# Patient Record
Sex: Female | Born: 1975 | Race: White | Hispanic: No | Marital: Married | Smoking: Never smoker
Health system: Southern US, Community
[De-identification: ages and names within clinical notes are randomized; demographics above are authoritative.]

## PROBLEM LIST (undated history)

## (undated) ENCOUNTER — Emergency Department: Discharge status: Absent without leave | Payer: Self-pay

## (undated) DIAGNOSIS — K5790 Diverticulosis of intestine, part unspecified, without perforation or abscess without bleeding: Secondary | ICD-10-CM

## (undated) DIAGNOSIS — F419 Anxiety disorder, unspecified: Secondary | ICD-10-CM

## (undated) DIAGNOSIS — K219 Gastro-esophageal reflux disease without esophagitis: Secondary | ICD-10-CM

## (undated) DIAGNOSIS — K602 Anal fissure, unspecified: Secondary | ICD-10-CM

## (undated) DIAGNOSIS — I10 Essential (primary) hypertension: Secondary | ICD-10-CM

## (undated) DIAGNOSIS — F909 Attention-deficit hyperactivity disorder, unspecified type: Secondary | ICD-10-CM

## (undated) DIAGNOSIS — K8020 Calculus of gallbladder without cholecystitis without obstruction: Secondary | ICD-10-CM

## (undated) DIAGNOSIS — D649 Anemia, unspecified: Secondary | ICD-10-CM

## (undated) DIAGNOSIS — K259 Gastric ulcer, unspecified as acute or chronic, without hemorrhage or perforation: Secondary | ICD-10-CM

## (undated) DIAGNOSIS — R7303 Prediabetes: Secondary | ICD-10-CM

## (undated) DIAGNOSIS — K859 Acute pancreatitis without necrosis or infection, unspecified: Secondary | ICD-10-CM

## (undated) DIAGNOSIS — M1990 Unspecified osteoarthritis, unspecified site: Secondary | ICD-10-CM

## (undated) DIAGNOSIS — M549 Dorsalgia, unspecified: Secondary | ICD-10-CM

## (undated) DIAGNOSIS — F329 Major depressive disorder, single episode, unspecified: Secondary | ICD-10-CM

## (undated) DIAGNOSIS — G4730 Sleep apnea, unspecified: Secondary | ICD-10-CM

## (undated) DIAGNOSIS — F32A Depression, unspecified: Secondary | ICD-10-CM

## (undated) HISTORY — DX: Diverticulosis of intestine, part unspecified, without perforation or abscess without bleeding: K57.90

## (undated) HISTORY — PX: ESOPHAGOGASTRODUODENOSCOPY: SHX1529

## (undated) HISTORY — PX: ECTOPIC PREGNANCY SURGERY: SHX613

## (undated) HISTORY — DX: Anemia, unspecified: D64.9

## (undated) HISTORY — DX: Morbid (severe) obesity due to excess calories: E66.01

## (undated) HISTORY — DX: Major depressive disorder, single episode, unspecified: F32.9

## (undated) HISTORY — PX: ABLATION: SHX5711

## (undated) HISTORY — PX: TUBAL LIGATION: SHX77

## (undated) HISTORY — DX: Anal fissure, unspecified: K60.2

## (undated) HISTORY — DX: Gastric ulcer, unspecified as acute or chronic, without hemorrhage or perforation: K25.9

## (undated) HISTORY — DX: Calculus of gallbladder without cholecystitis without obstruction: K80.20

## (undated) HISTORY — DX: Anxiety disorder, unspecified: F41.9

## (undated) HISTORY — DX: Gastro-esophageal reflux disease without esophagitis: K21.9

## (undated) HISTORY — DX: Depression, unspecified: F32.A

---

## 1998-04-21 HISTORY — PX: CHOLECYSTECTOMY: SHX55

## 2011-07-27 ENCOUNTER — Encounter: Payer: Self-pay | Admitting: *Deleted

## 2011-07-27 ENCOUNTER — Emergency Department
Admit: 2011-07-27 | Discharge: 2011-07-27 | Disposition: A | Discharge status: Home / Self Care | Payer: Managed Care, Other (non HMO)

## 2011-07-27 ENCOUNTER — Emergency Department (INDEPENDENT_AMBULATORY_CARE_PROVIDER_SITE_OTHER)
Admission: EM | Admit: 2011-07-27 | Discharge: 2011-07-27 | Disposition: A | Discharge status: Home / Self Care | Payer: Managed Care, Other (non HMO) | Source: Home / Self Care | Attending: Family Medicine | Admitting: Family Medicine

## 2011-07-27 DIAGNOSIS — IMO0002 Reserved for concepts with insufficient information to code with codable children: Secondary | ICD-10-CM

## 2011-07-27 DIAGNOSIS — S43401A Unspecified sprain of right shoulder joint, initial encounter: Secondary | ICD-10-CM

## 2011-07-27 HISTORY — DX: Dorsalgia, unspecified: M54.9

## 2011-07-27 HISTORY — DX: Unspecified osteoarthritis, unspecified site: M19.90

## 2011-07-27 HISTORY — DX: Sleep apnea, unspecified: G47.30

## 2011-07-27 HISTORY — DX: Essential (primary) hypertension: I10

## 2011-07-27 HISTORY — DX: Acute pancreatitis without necrosis or infection, unspecified: K85.90

## 2011-07-27 NOTE — ED Notes (Signed)
Pt was playing around with her husband yesterday and began to have R upper arm pain.  Pt has limited movement due to pain.

## 2011-07-27 NOTE — Discharge Instructions (Signed)
Apply ice pack for 30 to 45 minutes three or four times daily until improved. Take 2 Aleve tabs every 12 hours. Begin exercises as per instruction sheet.

## 2011-07-27 NOTE — ED Provider Notes (Signed)
History     CSN: [id number]  Arrival date & time 07/27/11  1203   First MD Initiated Contact with Patient 07/27/11 1241      Chief Complaint  Patient presents with  . Arm Pain    R upper arm      HPI Comments: Patient was playing around with her husband and children yesterday when she fell and felt pain in her right shoulder.  She has persistent pain when raising her right arm upwards.  No weakness or paresthesias  Patient is a 36 y.o. female presenting with shoulder pain. The history is provided by the patient and the spouse.  Shoulder Pain This is a new problem. The current episode started yesterday. The problem occurs constantly. The problem has not changed since onset.Pertinent negatives include no chest pain and no shortness of breath. Exacerbated by: moving right arm/shoulder. The symptoms are relieved by nothing. She has tried a warm compress for the symptoms. The treatment provided no relief.    Past Medical History  Diagnosis Date  . Sleep apnea   . Arthritis   . Hypertension   . Back pain   . Migraine   . Pancreatitis     Past Surgical History  Procedure Date  . Ectopic pregnancy surgery     twice  . Cholecystectomy   . Tubal ligation     Family History  Problem Relation Age of Onset  . Asthma Father     History  Substance Use Topics  . Smoking status: Never Smoker   . Smokeless tobacco: Not on file  . Alcohol Use: Yes     once a month    OB History    Grav Para Term Preterm Abortions TAB SAB Ect Mult Living                  Review of Systems  Respiratory: Negative for shortness of breath.   Cardiovascular: Negative for chest pain.  All other systems reviewed and are negative.    Allergies  Review of patient's allergies indicates no known allergies.  Home Medications   Current Outpatient Rx  Name Route Sig Dispense Refill  . ARMODAFINIL 150 MG PO TABS Oral Take 150 mg by mouth daily.    Marland Kitchen VITAMIN C 100 MG PO TABS Oral Take 100 mg by  mouth daily.    . BUPROPION HCL ER (XL) 150 MG PO TB24 Oral Take 300 mg by mouth daily.    Marland Kitchen VITAMIN D-3 PO Oral Take by mouth.    . CLONAZEPAM 0.5 MG PO TABS Oral Take 0.5 mg by mouth 3 (three) times daily as needed.    . DEXLANSOPRAZOLE 30 MG PO CPDR Oral Take 30 mg by mouth daily.    Marland Kitchen FEXOFENADINE HCL 30 MG PO TABS Oral Take 30 mg by mouth 2 (two) times daily.    . OMEGA-3 FATTY ACIDS 1000 MG PO CAPS Oral Take 2 g by mouth daily.    Marland Kitchen FLUTICASONE PROPIONATE 50 MCG/ACT NA SUSP Nasal Place 2 sprays into the nose daily.    Marland Kitchen LAMOTRIGINE 200 MG PO TABS Oral Take 200 mg by mouth daily.    Marland Kitchen METOPROLOL SUCCINATE ER 50 MG PO TB24 Oral Take 50 mg by mouth daily. Take with or immediately following a meal.    . QUETIAPINE FUMARATE ER 300 MG PO TB24 Oral Take 300 mg by mouth at bedtime.    Marland Kitchen VITAMIN B-12 100 MCG PO TABS Oral Take 50 mcg by mouth  daily.    Marland Kitchen VITAMIN E 100 UNITS PO CAPS Oral Take 100 Units by mouth daily.      BP 125/87  Pulse 105  Temp(Src) 98.5 F (36.9 C) (Oral)  Resp 16  Ht 5\' 3"  (1.6 m)  Wt 262 lb 8 oz (119.069 kg)  BMI 46.50 kg/m2  SpO2 97%  Physical Exam  Nursing note and vitals reviewed. Constitutional: She is oriented to person, place, and time. She appears well-developed and well-nourished.       Patient is obese (BMI 46.6)  HENT:  Head: Normocephalic and atraumatic.  Eyes: Conjunctivae are normal. Pupils are equal, round, and reactive to light.  Neck: Normal range of motion.  Cardiovascular: Normal rate and regular rhythm.   Pulmonary/Chest: Effort normal and breath sounds normal.  Musculoskeletal:       Right shoulder: She exhibits tenderness, bony tenderness and pain. She exhibits normal range of motion, no swelling, no effusion, no crepitus, no deformity, no laceration and normal pulse.       Arms:      Patient's right shoulder has full range of motion.  She can actively abduct above horizontal.  There is tenderness over the distal acromion and biceps  tendon insertion as noted on diagram.  Distal Neurovascular function is intact.   Neurological: She is alert and oriented to person, place, and time.  Skin: Skin is warm and dry.    ED Course  Procedures none   Dg Shoulder Right  07/27/2011  *RADIOLOGY REPORT*  Clinical Data: Right shoulder pain secondary to an injury yesterday.  RIGHT SHOULDER - 2+ VIEW  Comparison: None.  Findings: No fracture, dislocation, or soft tissue calcification. Osseous structures are normal.  IMPRESSION: Normal exam.  Original Report Authenticated By: Gwynn Burly, M.D.     1. Sprain of right shoulder       MDM  Mild rotator cuff injury is possible, but patient is able to fully abduct her right shoulder.  Apply ice pack for 30 to 45 minutes three or four times daily until improved. Take 2 Aleve tabs every 12 hours. Begin exercises as per instruction sheet ([facility] information and instruction handout given)  Followup with [facility] if not improving about two weeks.         Lattie Haw, MD 07/28/11 1450

## 2012-04-21 HISTORY — PX: BARIATRIC SURGERY: SHX1103

## 2013-01-06 DIAGNOSIS — I10 Essential (primary) hypertension: Secondary | ICD-10-CM | POA: Insufficient documentation

## 2013-10-04 DIAGNOSIS — G43909 Migraine, unspecified, not intractable, without status migrainosus: Secondary | ICD-10-CM | POA: Insufficient documentation

## 2013-10-04 DIAGNOSIS — F331 Major depressive disorder, recurrent, moderate: Secondary | ICD-10-CM | POA: Insufficient documentation

## 2013-10-11 ENCOUNTER — Ambulatory Visit (HOSPITAL_COMMUNITY): Payer: Self-pay | Admitting: Psychiatry

## 2014-01-04 DIAGNOSIS — Z9884 Bariatric surgery status: Secondary | ICD-10-CM | POA: Insufficient documentation

## 2014-08-31 DIAGNOSIS — K219 Gastro-esophageal reflux disease without esophagitis: Secondary | ICD-10-CM | POA: Insufficient documentation

## 2014-08-31 DIAGNOSIS — K449 Diaphragmatic hernia without obstruction or gangrene: Secondary | ICD-10-CM | POA: Insufficient documentation

## 2015-01-31 DIAGNOSIS — F900 Attention-deficit hyperactivity disorder, predominantly inattentive type: Secondary | ICD-10-CM | POA: Insufficient documentation

## 2016-05-29 DIAGNOSIS — F411 Generalized anxiety disorder: Secondary | ICD-10-CM | POA: Insufficient documentation

## 2016-06-10 ENCOUNTER — Encounter: Payer: Self-pay | Admitting: Gastroenterology

## 2016-06-10 ENCOUNTER — Encounter (HOSPITAL_COMMUNITY): Payer: Self-pay | Admitting: *Deleted

## 2016-06-10 ENCOUNTER — Emergency Department (HOSPITAL_COMMUNITY)
Admission: EM | Admit: 2016-06-10 | Discharge: 2016-06-11 | Disposition: A | Discharge status: Home / Self Care | Payer: 59 | Attending: Emergency Medicine | Admitting: Emergency Medicine

## 2016-06-10 DIAGNOSIS — I10 Essential (primary) hypertension: Secondary | ICD-10-CM | POA: Insufficient documentation

## 2016-06-10 DIAGNOSIS — R1011 Right upper quadrant pain: Secondary | ICD-10-CM | POA: Diagnosis not present

## 2016-06-10 DIAGNOSIS — Z9049 Acquired absence of other specified parts of digestive tract: Secondary | ICD-10-CM | POA: Diagnosis not present

## 2016-06-10 DIAGNOSIS — R1010 Upper abdominal pain, unspecified: Secondary | ICD-10-CM | POA: Diagnosis present

## 2016-06-10 DIAGNOSIS — N9489 Other specified conditions associated with female genital organs and menstrual cycle: Secondary | ICD-10-CM | POA: Diagnosis not present

## 2016-06-10 DIAGNOSIS — Z79899 Other long term (current) drug therapy: Secondary | ICD-10-CM | POA: Diagnosis not present

## 2016-06-10 LAB — URINALYSIS, ROUTINE W REFLEX MICROSCOPIC
BILIRUBIN URINE: NEGATIVE
Bacteria, UA: NONE SEEN
GLUCOSE, UA: NEGATIVE mg/dL
HGB URINE DIPSTICK: NEGATIVE
Ketones, ur: 5 mg/dL — AB
NITRITE: NEGATIVE
PH: 7 (ref 5.0–8.0)
Protein, ur: NEGATIVE mg/dL
SPECIFIC GRAVITY, URINE: 1.024 (ref 1.005–1.030)

## 2016-06-10 LAB — COMPREHENSIVE METABOLIC PANEL
ALBUMIN: 3.9 g/dL (ref 3.5–5.0)
ALK PHOS: 57 U/L (ref 38–126)
ALT: 21 U/L (ref 14–54)
AST: 20 U/L (ref 15–41)
Anion gap: 7 (ref 5–15)
BUN: 13 mg/dL (ref 6–20)
CALCIUM: 9.8 mg/dL (ref 8.9–10.3)
CO2: 27 mmol/L (ref 22–32)
Chloride: 102 mmol/L (ref 101–111)
Creatinine, Ser: 0.72 mg/dL (ref 0.44–1.00)
GFR calc Af Amer: >60 mL/min (ref >60)
GLUCOSE: 112 mg/dL — AB (ref 65–99)
POTASSIUM: 4.2 mmol/L (ref 3.5–5.1)
Sodium: 136 mmol/L (ref 135–145)
Total Bilirubin: 0.9 mg/dL (ref 0.3–1.2)
Total Protein: 7 g/dL (ref 6.5–8.1)

## 2016-06-10 LAB — HCG, QUANTITATIVE, PREGNANCY: hCG, Beta Chain, Quant, S: 1 m[IU]/mL (ref <5)

## 2016-06-10 LAB — CBC
HCT: 39.7 % (ref 36.0–46.0)
Hemoglobin: 13.2 g/dL (ref 12.0–15.0)
MCH: 28 pg (ref 26.0–34.0)
MCHC: 33.2 g/dL (ref 30.0–36.0)
MCV: 84.3 fL (ref 78.0–100.0)
PLATELETS: 216 10*3/uL (ref 150–400)
RBC: 4.71 MIL/uL (ref 3.87–5.11)
RDW: 12.8 % (ref 11.5–15.5)
WBC: 5.9 10*3/uL (ref 4.0–10.5)

## 2016-06-10 LAB — LIPASE, BLOOD: Lipase: 20 U/L (ref 11–51)

## 2016-06-10 LAB — I-STAT BETA HCG BLOOD, ED (MC, WL, AP ONLY): I-stat hCG, quantitative: 8.2 m[IU]/mL — ABNORMAL HIGH (ref <5)

## 2016-06-10 LAB — PREGNANCY, URINE: PREG TEST UR: NEGATIVE

## 2016-06-10 MED ORDER — ONDANSETRON 4 MG PO TBDP: 4.0000 mg | ORAL_TABLET | Freq: Three times a day (TID) | ORAL | 0 refills | Status: DC | PRN

## 2016-06-10 MED ORDER — ONDANSETRON 4 MG PO TBDP
8.0000 mg | ORAL_TABLET | Freq: Once | ORAL | Status: AC
  Administered 2016-06-10: 8 mg via ORAL
  Filled 2016-06-10: qty 2

## 2016-06-10 NOTE — ED Triage Notes (Signed)
Pt reports ongoing abd pain, had ct scan done yesterday but doesn't know results. Pt has diarrhea, no vomiting. Hx of pancreatitis.

## 2016-06-10 NOTE — ED Notes (Signed)
Pt ambulatory to restroom with independent, steady gait

## 2016-06-10 NOTE — ED Provider Notes (Signed)
[facility]-EMERGENCY DEPT Provider Note   CSN: [id number] Arrival date & time: 06/10/16  4098     History   Chief Complaint Chief Complaint  Patient presents with  . Abdominal Pain    HPI Natalie Hopkins is a 41 y.o. female.  Patient presents to the ED with a chief complaint of abdominal pain.  She reports that she has had upper abdominal pain for the past several days.  She reports a history of pancreatitis and is worried about a pancreatitis flare.  She reports associated nausea, but denies vomiting.  She states that she has had some associated diarrhea.  She denies and fevers or chills.  She states that she had a CT scan yesterday, but doesn't have the results yet.  She denies any dysuria or vaginal discharge.   The history is provided by the patient. No language interpreter was used.    Past Medical History:  Diagnosis Date  . Arthritis   . Back pain   . Hypertension   . Migraine   . Pancreatitis   . Sleep apnea     There are no active problems to display for this patient.   Past Surgical History:  Procedure Laterality Date  . CHOLECYSTECTOMY    . ECTOPIC PREGNANCY SURGERY     twice  . TUBAL LIGATION      OB History    No data available       Home Medications    Prior to Admission medications   Medication Sig Start Date End Date Taking? Authorizing Provider  Armodafinil (NUVIGIL) 150 MG tablet Take 150 mg by mouth daily.    Historical Provider, MD  Ascorbic Acid (VITAMIN C) 100 MG tablet Take 100 mg by mouth daily.    Historical Provider, MD  buPROPion (WELLBUTRIN XL) 150 MG 24 hr tablet Take 300 mg by mouth daily.    Historical Provider, MD  Cholecalciferol (VITAMIN D-3 PO) Take by mouth.    Historical Provider, MD  clonazePAM (KLONOPIN) 0.5 MG tablet Take 0.5 mg by mouth 3 (three) times daily as needed.    Historical Provider, MD  Dexlansoprazole (DEXILANT) 30 MG capsule Take 30 mg by mouth daily.    Historical Provider, MD  fexofenadine (ALLEGRA) 30 MG  tablet Take 30 mg by mouth 2 (two) times daily.    Historical Provider, MD  fish oil-omega-3 fatty acids 1000 MG capsule Take 2 g by mouth daily.    Historical Provider, MD  fluticasone (FLONASE) 50 MCG/ACT nasal spray Place 2 sprays into the nose daily.    Historical Provider, MD  lamoTRIgine (LAMICTAL) 200 MG tablet Take 200 mg by mouth daily.    Historical Provider, MD  metoprolol succinate (TOPROL-XL) 50 MG 24 hr tablet Take 50 mg by mouth daily. Take with or immediately following a meal.    Historical Provider, MD  QUEtiapine (SEROQUEL XR) 300 MG 24 hr tablet Take 300 mg by mouth at bedtime.    Historical Provider, MD  vitamin B-12 (CYANOCOBALAMIN) 100 MCG tablet Take 50 mcg by mouth daily.    Historical Provider, MD  vitamin E 100 UNIT capsule Take 100 Units by mouth daily.    Historical Provider, MD    Family History Family History  Problem Relation Age of Onset  . Asthma Father     Social History Social History  Substance Use Topics  . Smoking status: Never Smoker  . Smokeless tobacco: Not on file  . Alcohol use Yes     Comment: once a  month     Allergies   Patient has no known allergies.   Review of Systems Review of Systems  All other systems reviewed and are negative.    Physical Exam Updated Vital Signs BP 134/80 (BP Location: Left Arm)   Pulse 76   Temp 98.3 F (36.8 C) (Oral)   Resp 18   LMP 05/27/2016   SpO2 100%   Physical Exam  Constitutional: She is oriented to person, place, and time. She appears well-developed and well-nourished.  HENT:  Head: Normocephalic and atraumatic.  Eyes: Conjunctivae and EOM are normal. Pupils are equal, round, and reactive to light.  Neck: Normal range of motion. Neck supple.  Cardiovascular: Normal rate and regular rhythm.  Exam reveals no gallop and no friction rub.   No murmur heard. Pulmonary/Chest: Effort normal and breath sounds normal. No respiratory distress. She has no wheezes. She has no rales. She exhibits  no tenderness.  Abdominal: Soft. Bowel sounds are normal. She exhibits no distension and no mass. There is no tenderness. There is no rebound and no guarding.  No focal abdominal tenderness, no RLQ tenderness or pain at McBurney's point, no RUQ tenderness or Murphy's sign, no left-sided abdominal tenderness, no fluid wave, or signs of peritonitis   Musculoskeletal: Normal range of motion. She exhibits no edema or tenderness.  Neurological: She is alert and oriented to person, place, and time.  Skin: Skin is warm and dry.  Psychiatric: She has a normal mood and affect. Her behavior is normal. Judgment and thought content normal.  Nursing note and vitals reviewed.    ED Treatments / Results  Labs (all labs ordered are listed, but only abnormal results are displayed) Labs Reviewed  COMPREHENSIVE METABOLIC PANEL - Abnormal; Notable for the following:       Result Value   Glucose, Bld 112 (*)    All other components within normal limits  I-STAT BETA HCG BLOOD, ED ([facility], [facility], [facility] ONLY) - Abnormal; Notable for the following:    I-stat hCG, quantitative 8.2 (*)    All other components within normal limits  LIPASE, BLOOD  CBC  URINALYSIS, ROUTINE W REFLEX MICROSCOPIC  HCG, QUANTITATIVE, PREGNANCY  PREGNANCY, URINE    EKG  EKG Interpretation None       Radiology No results found.  Procedures Procedures (including critical care time)  Medications Ordered in ED Medications - No data to display   Initial Impression / Assessment and Plan / ED Course  I have reviewed the triage vital signs and the nursing notes.  Pertinent labs & imaging results that were available during my care of the patient were reviewed by me and considered in my medical decision making (see chart for details).     Patient with abdominal pain in the right upper quadrant.  Concerned about pancreatitis.  She has hx of cholecystectomy.  CT from outside facility yesterday was reviewed, no acute abnormality.   Presumed falsely elevated I-stat HCG, lab HCG and urine preg are negative.  Patient has tubal ligation.  I discussed this with Dr. Clayborne Dana, who agrees likely lab error.  Return if symptoms worsen, otherwise, recommend follow-up with GI.  Patient understands and agrees with the plan.  She is stable and ready for discharge.  Final Clinical Impressions(s) / ED Diagnoses   Final diagnoses:  Right upper quadrant abdominal pain    New Prescriptions New Prescriptions   ONDANSETRON (ZOFRAN ODT) 4 MG DISINTEGRATING TABLET    Take 1 tablet (4 mg total) by  mouth every 8 (eight) hours as needed for nausea or vomiting.     Roxy Horsemanobert Jamond Neels, PA-C 06/10/16 1422    Marily MemosJason Mesner, MD 06/11/16 [phone]

## 2016-06-10 NOTE — Discharge Instructions (Signed)
Please follow up with the gastroenterologist.

## 2016-06-19 ENCOUNTER — Ambulatory Visit: Payer: Self-pay | Admitting: Gastroenterology

## 2016-06-27 ENCOUNTER — Other Ambulatory Visit (INDEPENDENT_AMBULATORY_CARE_PROVIDER_SITE_OTHER): Payer: 59

## 2016-06-27 ENCOUNTER — Encounter: Payer: Self-pay | Admitting: Gastroenterology

## 2016-06-27 ENCOUNTER — Ambulatory Visit (INDEPENDENT_AMBULATORY_CARE_PROVIDER_SITE_OTHER): Payer: 59 | Admitting: Gastroenterology

## 2016-06-27 VITALS — BP 118/80 | HR 80 | Ht 63.0 in | Wt 219.5 lb

## 2016-06-27 DIAGNOSIS — K589 Irritable bowel syndrome without diarrhea: Secondary | ICD-10-CM

## 2016-06-27 DIAGNOSIS — R1013 Epigastric pain: Secondary | ICD-10-CM

## 2016-06-27 DIAGNOSIS — K219 Gastro-esophageal reflux disease without esophagitis: Secondary | ICD-10-CM

## 2016-06-27 LAB — TSH: TSH: 2.45 u[IU]/mL (ref 0.35–4.50)

## 2016-06-27 LAB — IGA: IGA: 248 mg/dL (ref 68–378)

## 2016-06-27 NOTE — Progress Notes (Addendum)
06/27/2016 Natalie StareBridget Hopkins [id number] 04/27/1975  CC:  Abdominal pain, alternating bowel habits  HISTORY OF PRESENT ILLNESS:  This is a 41 year-old female who is new to our office. She has past medical history of anxiety and depression, GERD, pancreatitis, hypertension, sleep apnea. She had a cholecystectomy in 2008 and sleeve gastrectomy in 2014. She tells me that [place] [facility] her pancreatitis episodes. She says that she's had 2 or 3 episodes of pancreatitis. They've done extensive testing and have apparently not been able to identify a source. She denies alcohol use. Her last episode was reportedly 2 years ago.  She said that she had GI history with [place] and had both endoscopy and colonoscopy performed by him, but that was several years ago.  Today she complains of both right upper quadrant/epigastric and lower abdominal pains. She also reports alternating constipation and diarrhea, but constipation predominant with intermittent nausea. She says that the diarrheal episodes tend to occur and follow an episode of severe constipation. Has tried a probiotic without much success in the past.  Her upper abdominal pain is described as a sharp stabbing type pain and her lower abdominal pain is described as cramping. Both pains seem to come and go and she is currently not having much pain at all at this time. She had a CT scan of the abdomen and pelvis with contrast on February 19 that showed no findings to suggest acute pancreatitis, and no other acute findings in the abdomen or pelvis. She was noted to have postcholecystectomy changes with similar dilation of her bile duct compared to prior studies. 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 MRCP in November 2016 showed a similar finding with no filling defects.  She also  reports acid reflux and was just placed on Dexilant 30 mg daily about one week ago and has already noticed some improvement with that.     Past Medical History:  Diagnosis Date  . Anal fissure   . Anemia   . Anxiety   . Arthritis   . Back pain   . Depression   . Diverticulosis    on CT  . Gallstones   . GERD (gastroesophageal reflux disease)   . Hypertension   . Migraine   . Multiple gastric ulcers   . Pancreatitis   . Sleep apnea   . Sleep apnea    Past Surgical History:  Procedure Laterality Date  . 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 CHOLECYSTECTOMY  2000  . ECTOPIC PREGNANCY SURGERY     twice, with removal of 1 tube  . TUBAL LIGATION      reports that she has never smoked. She has never used smokeless tobacco. She reports that she drinks alcohol. She reports that she does not use drugs. family history includes Asthma in her father; Bladder Cancer in her mother; Breast cancer in her maternal grandmother; Diabetes in her paternal grandfather; Ovarian cancer in her maternal aunt. No Known Allergies    Outpatient Encounter Prescriptions as of 06/27/2016  Medication Sig  . Ascorbic Acid (VITAMIN C) 100 MG tablet Take 100 mg by mouth daily.  Marland Kitchen. buPROPion (WELLBUTRIN XL) 150 MG 24 hr tablet Take 300 mg by mouth daily.  . Cholecalciferol (VITAMIN D-3 PO) Take by mouth.  .Marland Kitchen  clonazePAM (KLONOPIN) 0.5 MG tablet Take 0.5 mg by mouth 3 (three) times daily as needed.  Marland Kitchen Dexlansoprazole (DEXILANT) 30 MG capsule Take 30 mg by mouth daily.  . fexofenadine (ALLEGRA) 30 MG tablet Take 30 mg by mouth 2 (two) times daily.  . fish oil-omega-3 fatty acids 1000 MG capsule Take 2 g by mouth daily.  . fluticasone (FLONASE) 50 MCG/ACT nasal spray Place 2 sprays into the nose daily.  . Multiple Vitamin (MULTIVITAMIN) tablet Take 1 tablet by mouth daily.  . ondansetron (ZOFRAN ODT) 4 MG disintegrating tablet Take 1 tablet (4 mg total) by mouth every 8 (eight) hours as needed for nausea  or vomiting.  . vitamin B-12 (CYANOCOBALAMIN) 100 MCG tablet Take 50 mcg by mouth daily.  . vitamin E 100 UNIT capsule Take 100 Units by mouth daily.  . [DISCONTINUED] Armodafinil (NUVIGIL) 150 MG tablet Take 150 mg by mouth daily.  . [DISCONTINUED] lamoTRIgine (LAMICTAL) 200 MG tablet Take 200 mg by mouth daily.  . [DISCONTINUED] metoprolol succinate (TOPROL-XL) 50 MG 24 hr tablet Take 50 mg by mouth daily. Take with or immediately following a meal.  . [DISCONTINUED] QUEtiapine (SEROQUEL XR) 300 MG 24 hr tablet Take 300 mg by mouth at bedtime.   No facility-administered encounter medications on file as of 06/27/2016.      REVIEW OF SYSTEMS  : All other systems reviewed and negative except where noted in the History of Present Illness.   PHYSICAL EXAM: BP 118/80 (BP Location: Left Arm, Patient Position: Sitting, Cuff Size: Normal)   Pulse 80   Ht 5\' 3"  (1.6 m) Comment: height measured without shoes  Wt 219 lb 8 oz (99.6 kg)   LMP 06/20/2016   BMI 38.88 kg/m  General: Well developed white female in no acute distress Head: Normocephalic and atraumatic Eyes:  Sclerae anicteric, conjunctiva pink. Ears: Normal auditory acuity Lungs: Clear throughout to auscultation; no increased WOB. Heart: Regular rate and rhythm Abdomen: Soft, non-distended.  Normal bowel sounds.  Mild epigastric TTP. Musculoskeletal: Symmetrical with no gross deformities  Skin: No lesions on visible extremities Extremities: No edema  Neurological: Alert oriented x 4, grossly non-focal Psychological:  Alert and cooperative. Normal mood and affect  ASSESSMENT AND PLAN: -Bowel habits alternating from constipation to diarrhea, but constipation predominant:  I suspect that she likely has IBS as she has undergone evaluation from a GI standpoint several years ago that was reportedly unremarkable.  I'm going to have her start MiraLAX daily, increase to twice daily if needed. May also need to consider adding a daily fiber  supplement such as Benefiber or Citrucel to her regimen as well to help with stool bulk.  We'll check TSH and celiac labs. -GERD:  Just started on Dexilant 30 mg daily about 1 week ago.  Already  feels like it is helping to some degree. She will continue this for now. 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 Status post cholecystectomy in 2000.  *Will follow-up in 4-6 weeks. *In the interim we are going to try to obtain records from both of the GI physicians that she has seen in the once in [place] area including Dr. Jayme Cloud and Dr. Eliberto Ivory.  **Received some previous GI records. EGD and upper endoscopic ultrasound in March 2013 that showed mild to moderate reflux esophagitis, normal common bile duct, absent gallbladder. Also, "Pancreas with suggestion of prior acute pancreatitis with no mass  or cystic lesions but evidence of heterogeneity in the head from prior inflammation likely, there may be some early changes of chronic pancreatitis. Given overall fullness of the pancreas parenchyma in hyperechogenicity, this may be related to fatty infiltration of the pancreas which is nonspecific versus mild edema. Of note, her IgG4 level has been negative in the past and her CT scans although they have showed edematous pancreas without ductal dilation has not been specific for autoimmune pancreatitis. 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   CC:  Long, Scott, PA-C  Agree with Ms. Rise Mu management.  Iva Boop, MD, [facility]

## 2016-06-27 NOTE — Patient Instructions (Signed)
Please purchase the following medications over the counter and take as directed:  Start Miralax daily, increase to twice a day if needed.   Continue Dexilant.  Your physician has requested that you go to the basement for lab work before leaving today.

## 2016-06-30 LAB — TISSUE TRANSGLUTAMINASE, IGA: TISSUE TRANSGLUTAMINASE AB, IGA: 1 U/mL (ref <4)

## 2016-07-01 ENCOUNTER — Encounter: Payer: Self-pay | Admitting: Gastroenterology

## 2016-07-01 DIAGNOSIS — K589 Irritable bowel syndrome without diarrhea: Secondary | ICD-10-CM | POA: Insufficient documentation

## 2016-07-01 DIAGNOSIS — K219 Gastro-esophageal reflux disease without esophagitis: Secondary | ICD-10-CM | POA: Insufficient documentation

## 2016-07-01 DIAGNOSIS — R1013 Epigastric pain: Secondary | ICD-10-CM | POA: Insufficient documentation

## 2016-08-05 ENCOUNTER — Ambulatory Visit: Payer: Self-pay | Admitting: Internal Medicine

## 2016-11-22 ENCOUNTER — Encounter: Payer: Self-pay | Admitting: Emergency Medicine

## 2016-11-22 ENCOUNTER — Emergency Department
Admission: EM | Admit: 2016-11-22 | Discharge: 2016-11-22 | Disposition: A | Discharge status: Home / Self Care | Payer: 59 | Source: Home / Self Care | Attending: Family Medicine | Admitting: Family Medicine

## 2016-11-22 DIAGNOSIS — G43009 Migraine without aura, not intractable, without status migrainosus: Secondary | ICD-10-CM

## 2016-11-22 DIAGNOSIS — R002 Palpitations: Secondary | ICD-10-CM

## 2016-11-22 DIAGNOSIS — M26621 Arthralgia of right temporomandibular joint: Secondary | ICD-10-CM

## 2016-11-22 MED ORDER — METOCLOPRAMIDE HCL 5 MG/ML IJ SOLN
5.0000 mg | Freq: Once | INTRAMUSCULAR | Status: AC
  Administered 2016-11-22: 5 mg via INTRAMUSCULAR

## 2016-11-22 MED ORDER — DEXAMETHASONE SODIUM PHOSPHATE 10 MG/ML IJ SOLN
10.0000 mg | Freq: Once | INTRAMUSCULAR | Status: AC
  Administered 2016-11-22: 10 mg via INTRAMUSCULAR

## 2016-11-22 MED ORDER — KETOROLAC TROMETHAMINE 60 MG/2ML IM SOLN
60.0000 mg | Freq: Once | INTRAMUSCULAR | Status: AC
  Administered 2016-11-22: 60 mg via INTRAMUSCULAR

## 2016-11-22 NOTE — ED Provider Notes (Signed)
[name]    CSN: [id number] Arrival date & time: 11/22/16  1452     History   Chief Complaint Chief Complaint  Patient presents with  . Headache    HPI Natalie Hopkins is a 41 y.o. female.   Patient complains of right side migraine headache and earache for 4 days.  She has had nausea without vomiting.  No fevers, chills, and sweats.  She gets about two similar headaches per year. She also complains of occasional palpitations without chest pain or shortness of breath   The history is provided by the patient.  Migraine  This is a new problem. The current episode started more than 2 days ago. The problem occurs constantly. The problem has not changed since onset.Associated symptoms include headaches. Pertinent negatives include no chest pain, no abdominal pain and no shortness of breath. Nothing aggravates the symptoms. Nothing relieves the symptoms. Treatments tried: Advil and muscle relaxant.    Past Medical History:  Diagnosis Date  . Anal fissure   . Anemia   . Anxiety   . Arthritis   . Back pain   . Depression   . Diverticulosis    on CT  . Gallstones   . GERD (gastroesophageal reflux disease)   . Hypertension   . Migraine   . Multiple gastric ulcers   . Pancreatitis   . Sleep apnea   . Sleep apnea     Patient Active Problem List   Diagnosis Date Noted  . Irritable bowel syndrome 07/01/2016  . Abdominal pain, epigastric 07/01/2016  . Gastroesophageal reflux disease 07/01/2016    Past Surgical History:  Procedure Laterality Date  . 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 CHOLECYSTECTOMY  2000  . ECTOPIC PREGNANCY SURGERY     twice, with removal of 1 tube  . TUBAL LIGATION      OB History    No data available       Home Medications    Prior to Admission medications   Medication Sig Start Date End Date Taking? Authorizing Provider  FLUoxetine (PROZAC) 10 MG capsule Take 10 mg by mouth daily.   Yes [provider]    traZODone (DESYREL) 100 MG tablet Take 100 mg by mouth at bedtime.   Yes [provider]  Ascorbic Acid (VITAMIN C) 100 MG tablet Take 100 mg by mouth daily.    [provider]  buPROPion (WELLBUTRIN XL) 150 MG 24 hr tablet Take 300 mg by mouth daily.    [provider]  Cholecalciferol (VITAMIN D-3 PO) Take by mouth.    [provider]  clonazePAM (KLONOPIN) 0.5 MG tablet Take 0.5 mg by mouth 3 (three) times daily as needed.    [provider]  Dexlansoprazole (DEXILANT) 30 MG capsule Take 30 mg by mouth daily.    [provider]  fexofenadine (ALLEGRA) 30 MG tablet Take 30 mg by mouth 2 (two) times daily.    [provider]  fish oil-omega-3 fatty acids 1000 MG capsule Take 2 g by mouth daily.    [provider]  fluticasone (FLONASE) 50 MCG/ACT nasal spray Place 2 sprays into the nose daily.    [provider]  Multiple Vitamin (MULTIVITAMIN) tablet Take 1 tablet by mouth daily.    [provider]  ondansetron (ZOFRAN ODT) 4 MG disintegrating tablet Take 1 tablet (4 mg total) by mouth every 8 (eight) hours as needed for nausea or vomiting. 06/10/16   Roxy Horseman,  PA-C  vitamin B-12 (CYANOCOBALAMIN) 100 MCG tablet Take 50 mcg by mouth daily.    [provider]  vitamin E 100 UNIT capsule Take 100 Units by mouth daily.    [provider]    Family History Family History  Problem Relation Age of Onset  . Asthma Father   . Bladder Cancer Mother   . Breast cancer Maternal Grandmother   . Diabetes Paternal Grandfather   . Ovarian cancer Maternal Aunt     Social History Social History  Substance Use Topics  . Smoking status: Never Smoker  . Smokeless tobacco: Never Used  . Alcohol use Yes     Comment: once a month     Allergies   Patient has no known allergies.   Review of Systems Review of Systems  Constitutional: Positive for activity change, appetite change and  fatigue. Negative for chills, diaphoresis and fever.  HENT: Negative.   Eyes: Negative.   Respiratory: Negative.  Negative for shortness of breath.   Cardiovascular: Positive for palpitations. Negative for chest pain.  Gastrointestinal: Positive for nausea. Negative for abdominal pain and vomiting.  Genitourinary: Negative.   Musculoskeletal: Negative.   Skin: Negative.   Neurological: Positive for headaches. Negative for tremors, seizures, syncope, speech difficulty, weakness and numbness.     Physical Exam Triage Vital Signs ED Triage Vitals [11/22/16 1516]  Enc Vitals Group     BP 125/82     Pulse Rate 100     Resp      Temp 98.3 F (36.8 C)     Temp Source Oral     SpO2 97 %     Weight 229 lb (103.9 kg)     Height      Head Circumference      Peak Flow      Pain Score 8     Pain Loc      Pain Edu?      Excl. in GC?    No data found.   Updated Vital Signs BP 125/82 (BP Location: Right Arm)   Pulse 100   Temp 98.3 F (36.8 C) (Oral)   Wt 229 lb (103.9 kg)   LMP 11/13/2016 (Approximate)   SpO2 97%   BMI 40.57 kg/m   Visual Acuity Right Eye Distance:   Left Eye Distance:   Bilateral Distance:    Right Eye Near:   Left Eye Near:    Bilateral Near:     Physical Exam Nursing notes and Vital Signs reviewed. Appearance:  Patient appears stated age, and in no acute distress Eyes:  Pupils are equal, round, and reactive to light and accomodation.  Extraocular movement is intact.  Conjunctivae are not inflamed.  Fundi benign; no photophobia.  Ears:  Canals normal.  Tympanic membranes normal.  There is distinct tenderness over the right temporomandibular joint.  Palpation there recreates her pain.   Nose:  Mildly congested turbinates.  No sinus tenderness.    Pharynx:  Normal Neck:  Supple.  No adenopathy. Lungs:  Clear to auscultation.  Breath sounds are equal.  Moving air well. Heart:  Regular rate and rhythm without murmurs, rubs, or gallops.  Abdomen:   Nontender without masses or hepatosplenomegaly.  Bowel sounds are present.  No CVA or flank tenderness.  Extremities:  No edema.  Skin:  No rash present.   Neurologic:  Cranial nerves 2 through 12 are normal.  Patellar, achilles, and elbow reflexes are normal.  Cerebellar function is intact (finger-to-nose and rapid  alternating hand movement).  Gait and station are normal.   UC Treatments / Results  Labs (all labs ordered are listed, but only abnormal results are displayed) Labs Reviewed - No data to display  EKG  EKG Interpretation None       Radiology No results found.  Procedures Procedures (including critical care time)  Medications Ordered in UC Medications  ketorolac (TORADOL) injection 60 mg (60 mg Intramuscular Given 11/22/16 1612)  dexamethasone (DECADRON) injection 10 mg (10 mg Intramuscular Given 11/22/16 1612)  metoCLOPramide (REGLAN) injection 5 mg (5 mg Intramuscular Given 11/22/16 1612)     Initial Impression / Assessment and Plan / UC Course  I have reviewed the triage vital signs and the nursing notes.  Pertinent labs & imaging results that were available during my care of the patient were reviewed by me and considered in my medical decision making (see chart for details).    Administered Toradol 60mg  IM  Administered Decadron 10mg  IM.  Administered Reglan 5mg  IM. Recommend dental and/or ENT evaluation for right TMJ pain. If symptoms become significantly worse during the night or over the weekend, proceed to the local emergency room.  Recommend follow-up with cardiologist for history of palpitations.    Final Clinical Impressions(s) / UC Diagnoses   Final diagnoses:  Migraine without aura and without status migrainosus, not intractable  Arthralgia of right temporomandibular joint  Palpitations    New Prescriptions Discharge Medication List as of 11/22/2016  3:59 PM       Lattie HawBeese, Stephen A, MD 12/01/16 [phone]

## 2016-11-22 NOTE — ED Triage Notes (Signed)
Pt c/o HA, right ear pain and SOB x4 days. Also states she has been having heart palpitations for the past year. Advised by ER that she needs to see a cardiologist but has not made appt.

## 2016-11-22 NOTE — Discharge Instructions (Signed)
Recommend dental and/or ENT evaluation for right TMJ pain. If symptoms become significantly worse during the night or over the weekend, proceed to the local emergency room.

## 2017-12-18 ENCOUNTER — Encounter: Payer: Self-pay | Admitting: Osteopathic Medicine

## 2017-12-18 ENCOUNTER — Ambulatory Visit (INDEPENDENT_AMBULATORY_CARE_PROVIDER_SITE_OTHER): Payer: 59 | Admitting: Osteopathic Medicine

## 2017-12-18 DIAGNOSIS — K859 Acute pancreatitis without necrosis or infection, unspecified: Secondary | ICD-10-CM | POA: Insufficient documentation

## 2017-12-18 DIAGNOSIS — N898 Other specified noninflammatory disorders of vagina: Secondary | ICD-10-CM | POA: Diagnosis not present

## 2017-12-18 DIAGNOSIS — R002 Palpitations: Secondary | ICD-10-CM | POA: Diagnosis not present

## 2017-12-18 DIAGNOSIS — Z8759 Personal history of other complications of pregnancy, childbirth and the puerperium: Secondary | ICD-10-CM

## 2017-12-18 DIAGNOSIS — Z23 Encounter for immunization: Secondary | ICD-10-CM | POA: Diagnosis not present

## 2017-12-18 DIAGNOSIS — F319 Bipolar disorder, unspecified: Secondary | ICD-10-CM | POA: Insufficient documentation

## 2017-12-18 DIAGNOSIS — F988 Other specified behavioral and emotional disorders with onset usually occurring in childhood and adolescence: Secondary | ICD-10-CM | POA: Insufficient documentation

## 2017-12-18 DIAGNOSIS — R350 Frequency of micturition: Secondary | ICD-10-CM

## 2017-12-18 DIAGNOSIS — N393 Stress incontinence (female) (male): Secondary | ICD-10-CM

## 2017-12-18 DIAGNOSIS — I499 Cardiac arrhythmia, unspecified: Secondary | ICD-10-CM

## 2017-12-18 DIAGNOSIS — Z8719 Personal history of other diseases of the digestive system: Secondary | ICD-10-CM

## 2017-12-18 DIAGNOSIS — R7301 Impaired fasting glucose: Secondary | ICD-10-CM

## 2017-12-18 DIAGNOSIS — Z9884 Bariatric surgery status: Secondary | ICD-10-CM

## 2017-12-18 DIAGNOSIS — F32A Depression, unspecified: Secondary | ICD-10-CM | POA: Insufficient documentation

## 2017-12-18 DIAGNOSIS — K219 Gastro-esophageal reflux disease without esophagitis: Secondary | ICD-10-CM

## 2017-12-18 DIAGNOSIS — G4730 Sleep apnea, unspecified: Secondary | ICD-10-CM | POA: Insufficient documentation

## 2017-12-18 DIAGNOSIS — K589 Irritable bowel syndrome without diarrhea: Secondary | ICD-10-CM

## 2017-12-18 DIAGNOSIS — F329 Major depressive disorder, single episode, unspecified: Secondary | ICD-10-CM | POA: Insufficient documentation

## 2017-12-18 LAB — POCT URINALYSIS DIPSTICK
Bilirubin, UA: NEGATIVE
Blood, UA: NEGATIVE
GLUCOSE UA: NEGATIVE
KETONES UA: NEGATIVE
NITRITE UA: NEGATIVE
PH UA: 7.5 (ref 5.0–8.0)
PROTEIN UA: NEGATIVE
SPEC GRAV UA: 1.02 (ref 1.010–1.025)
Urobilinogen, UA: 1 E.U./dL

## 2017-12-18 NOTE — Patient Instructions (Addendum)
Plan:  For irregular heartbeat, this sounds like you may be having intermittent tachycardia or possibly atrial fibrillation.  Let's get a Holter monitor and see if we can catch an episode on here, if not will need to send you to a cardiologist to discuss initiating a long-term monitor.  We will also get an echocardiogram to evaluate structure of the heart.  We will get some blood work as well.  If you experience serious chest pain, palpitations, especially if associated with dizziness or trouble breathing, please call 911.  For urinary issues, this sounds most likely cystocele/stress incontinence, which can also predispose people to frequent urinary tract infections.  Would work on [employer] exercises, emptying bladder every few hours whether or not you feel an urge to go to the bathroom, and would follow-up with OB/GYN for pelvic exam.  We will of course send urine for analysis to see if there is an infection, we will call you over the weekend if we need to initiate antibiotics.  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  Please let us know sooner if there are any questions or problems, and we will call you once we have results of your blood work back if there is anything urgent that requires follow-up sooner.

## 2017-12-18 NOTE — Progress Notes (Signed)
HPI: Natalie Hopkins is a 42 y.o. female who  has a past medical history of Anal fissure, Anemia, Anxiety, Arthritis, Back pain, Depression, Diverticulosis, Gallstones, GERD (gastroesophageal reflux disease), Hypertension, Migraine, Multiple gastric ulcers, Pancreatitis, Sleep apnea, and Sleep apnea.  she presents to [facility] today, 12/18/17,  for chief complaint of:  New patient here to establish care, multiple concerns.  See headings and review of systems.    Patient was asked to prioritize problems today, most pressing issues are   irregular heart beat   vaginal discharge  Urinary frequency/incontinence    CARDIOVASCULAR  Will have bouts of 30-40 minutes feeling like hear heart is beating really fast or feeling like she is skipping beats. Has been going on over a year. Was going to ER but by time of eval, sx had resolved. Causing her SOB and dizziness.  Reports history of elevated blood pressure and atrial fibrillation -inquired further about the possible diagnosis of atrial fibrillation, she states that this was mentioned by a doctor at one point as a possible cause of her symptoms but was never actually seen on any kind of EKG or other cardiac monitoring.  She, is on no medications for treatment of these conditions such as antihypertensives or anticoagulants.  Previously on antihypertensive medications but was able to come off of the status post bariatric surgery  RESPIRATORY Never smoker.  Reports cough on occasion.  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 Wellbutrin 300 mg nightly, Prozac 20 mg nightly, trazodone unknown dosage nightly. Also taking Klonopin 0.5 mg as needed  RENAL/URINARY  Incontinence worsening over time, feels like not voiding completely, a lot of pain/pressure and frequency. Incontinence w/ sneeze and  cough. Declined pelvic exam today, has upcoming appointment with GYN.  Occasional vaginal discharge which is clear.  REPRODUCTIVE Reports history of abnormal Pap smear years ago Status post BTL [id number], history of 2 tubal pregnancies Not currently interested in STD screening.  Sexually active with one female partner - married.   GASTROINTESTINAL Status post sleeve gastrectomy cholecystectomy  ENDOCRINE  MUSCULOSKELETAL/RHEUM Ports history of slipped disc L5/S1  HEENT Status post sinus polyp removal     Patient is accompanied by husband who assists with history-taking.      Past medical, surgical, social and family history reviewed:  Patient Active Problem List   Diagnosis Date Noted  . Attention deficit disorder 12/18/2017  . Bipolar disorder (HCC) 12/18/2017  . Depressive disorder 12/18/2017  . Impaired fasting glucose 12/18/2017  . Pancreatitis 12/18/2017  . Sleep apnea 12/18/2017  . Irritable bowel syndrome 07/01/2016  . Abdominal pain, epigastric 07/01/2016  . Gastroesophageal reflux disease 07/01/2016  . GAD (generalized anxiety disorder) 05/29/2016  . Attention deficit hyperactivity disorder (ADHD), inattentive type, 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  . Gastro-esophageal reflux disease without esophagitis 08/31/2014  . Hiatal hernia 08/31/2014  . Status post bariatric surgery 01/04/2014  . MDD (major depressive disorder), recurrent episode, moderate (HCC) 10/04/2013  . Migraines 10/04/2013  . Hypertension 01/06/2013    Past Surgical History:  Procedure Laterality Date  . 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 CHOLECYSTECTOMY  2000  . ECTOPIC PREGNANCY SURGERY     twice, with removal of 1 tube  . TUBAL LIGATION      Social History   Tobacco Use  . Smoking status: Never Smoker  . Smokeless tobacco: Never Used  Substance Use Topics  .  Alcohol use: Yes    Comment: once a month    Family History  Problem Relation Age of Onset  . Asthma Father   .  Bladder Cancer Mother   . Irregular heart beat Mother   . Breast cancer Maternal Grandmother   . Diabetes Paternal Grandfather   . Ovarian cancer Maternal Aunt      Current medication list and allergy/intolerance information reviewed:    Current Outpatient Medications  Medication Sig Dispense Refill  . Ascorbic Acid (VITAMIN C) 100 MG tablet Take 100 mg by mouth daily.    Marland Kitchen buPROPion (WELLBUTRIN XL) 150 MG 24 hr tablet Take 300 mg by mouth daily.    . Cholecalciferol (VITAMIN D-3 PO) Take by mouth.    . clonazePAM (KLONOPIN) 0.5 MG tablet Take 0.5 mg by mouth 3 (three) times daily as needed.    Marland Kitchen Dexlansoprazole (DEXILANT) 30 MG capsule Take 30 mg by mouth daily.    . fexofenadine (ALLEGRA) 30 MG tablet Take 30 mg by mouth 2 (two) times daily.    . fish oil-omega-3 fatty acids 1000 MG capsule Take 2 g by mouth daily.    Marland Kitchen FLUoxetine (PROZAC) 10 MG capsule Take 10 mg by mouth daily.    . fluticasone (FLONASE) 50 MCG/ACT nasal spray Place 2 sprays into the nose daily.    . Multiple Vitamin (MULTIVITAMIN) tablet Take 1 tablet by mouth daily.    . ondansetron (ZOFRAN ODT) 4 MG disintegrating tablet Take 1 tablet (4 mg total) by mouth every 8 (eight) hours as needed for nausea or vomiting. 10 tablet 0  . traZODone (DESYREL) 100 MG tablet Take 100 mg by mouth at bedtime.    . vitamin B-12 (CYANOCOBALAMIN) 100 MCG tablet Take 50 mcg by mouth daily.    . vitamin E 100 UNIT capsule Take 100 Units by mouth daily.     No current facility-administered medications for this visit.     No Known Allergies    Review of Systems:  Constitutional:  No  fever, no chills, No recent illness, +unintentional weight changes. +significant fatigue.   HEENT: +headache, +vision change, +hearing change, No sore throat, +sinus pressure  Cardiac: No  chest pain, No  pressure, No palpitations, No  Orthopnea, +LE swelling  Respiratory:  No  shortness of breath. +Cough  Gastrointestinal: +abdominal pain, No   nausea, No  vomiting,  No  blood in stool, No  diarrhea, +constipation, +GERD  Musculoskeletal: +myalgia/arthralgia, +back pain, +neck pain  Skin: No  Rash, No other wounds/concerning lesions  Genitourinary: +incontinence, No  abnormal genital bleeding, +abnormal genital discharge  Hem/Onc: No  easy bruising/bleeding, No  abnormal lymph node  Endocrine: No cold intolerance,  No heat intolerance. +polyuria/polydipsia/polyphagia   Neurologic: +arm/leg weakness, +vetgigo/dizziness, No  slurred speech/focal weakness/facial droop+  Psychiatric: +concerns with depression, No  concerns with anxiety, No sleep problems, +mood sweings, +memory concerns  Exam:  BP 127/68 (BP Location: Left Arm, Patient Position: Sitting, Cuff Size: Large)   Pulse 90   Temp 98.2 F (36.8 C) (Oral)   Ht 5\' 3"  (1.6 m)   Wt 230 lb 1.6 oz (104.4 kg)   BMI 40.76 kg/m   Constitutional: VS see above. General Appearance: alert, well-developed, well-nourished, NAD  Eyes: Normal lids and conjunctive, non-icteric sclera  Ears, Nose, Mouth, Throat: MMM, Normal external inspection ears/nares/mouth/lips/gums. TM normal bilaterally. Pharynx/tonsils no erythema, no exudate. Nasal mucosa normal.   Neck: No masses, trachea midline. No thyroid enlargement. No tenderness/mass appreciated. No lymphadenopathy  Respiratory: Normal respiratory effort. no wheeze, no rhonchi, no rales  Cardiovascular: S1/S2 normal, no murmur, no rub/gallop auscultated. RRR. No lower extremity edema. Pedal pulse II/IV bilaterally DP and PT. No carotid bruit or JVD. No abdominal aortic bruit.  Gastrointestinal: Nontender, no masses. No hepatomegaly, no splenomegaly. No hernia appreciated. Bowel sounds normal. Rectal exam deferred.   Musculoskeletal: Gait normal. No clubbing/cyanosis of digits.   Neurological: Normal balance/coordination. No tremor. No cranial nerve deficit on limited exam. Motor and sensation intact and symmetric. Cerebellar  reflexes intact.   Skin: warm, dry, intact. No rash/ulcer. No concerning nevi or subq nodules on limited exam.    Psychiatric: Normal judgment/insight. Normal mood and affect. Oriented x3.    Results for orders placed or performed in visit on 12/18/17 (from the past 72 hour(s))  Urinalysis, microscopic only     Status: None   Collection Time: 12/18/17  9:21 AM  Result Value Ref Range   WBC, UA NONE SEEN 0 - 5 /HPF   RBC / HPF 0-2 0 - 2 /HPF   Squamous Epithelial / LPF 0-5 < OR = 5 /HPF   Bacteria, UA NONE SEEN NONE SEEN /HPF   Hyaline Cast NONE SEEN NONE SEEN /LPF  POCT Urinalysis Dipstick     Status: Abnormal   Collection Time: 12/18/17  9:53 AM  Result Value Ref Range   Color, UA Yellow    Clarity, UA clear    Glucose, UA Negative Negative   Bilirubin, UA negative    Ketones, UA negative    Spec Grav, UA 1.020 1.010 - 1.025   Blood, UA negative    pH, UA 7.5 5.0 - 8.0   Protein, UA Negative Negative   Urobilinogen, UA 1.0 0.2 or 1.0 E.U./dL   Nitrite, UA negative    Leukocytes, UA Trace (A) Negative   Appearance     Odor    Fe+TIBC+Fer     Status: Abnormal   Collection Time: 12/18/17 10:32 AM  Result Value Ref Range   Iron 59 40 - 190 mcg/dL   TIBC 657 846 - 962 mcg/dL (calc)   %SAT 15 (L) 16 - 45 % (calc)   Ferritin 13 (L) 16 - 232 ng/mL  High sensitivity CRP     Status: Abnormal   Collection Time: 12/18/17 10:35 AM  Result Value Ref Range   hs-CRP 5.9 (H) mg/L    Comment: Reference Range Optimal <1.0 Jellinger PS et al. Baruch Goldmann Pract.2017;23(Suppl 2):1-87. . For ages >19 Years: hs-CRP mg/L  Risk According to AHA/CDC Guidelines <1.0         Lower relative cardiovascular risk. 1.0-3.0      Average relative cardiovascular risk. 3.1-10.0     Higher relative cardiovascular risk.              Consider retesting in 1 to 2 weeks to              exclude a benign transient elevation              in the baseline CRP value secondary              to infection or  inflammation. >10.0        Persistent elevation, upon retesting,              may be associated with infection and              inflammation. .   Rheumatoid factor     Status:  None   Collection Time: 12/18/17 10:35 AM  Result Value Ref Range   Rhuematoid fact SerPl-aCnc <14 <14 IU/mL  Sedimentation rate     Status: None   Collection Time: 12/18/17 10:35 AM  Result Value Ref Range   Sed Rate 6 0 - 20 mm/h  CK     Status: None   Collection Time: 12/18/17 10:35 AM  Result Value Ref Range   Total CK 74 29 - 143 U/L  Folate     Status: None   Collection Time: 12/18/17 10:35 AM  Result Value Ref Range   Folate 8.8 ng/mL    Comment:                            Reference Range                            Low:           <3.4                            Borderline:    3.4-5.4                            Normal:        >5.4 .   VITAMIN D 25 Hydroxy (Vit-D Deficiency, Fractures)     Status: Abnormal   Collection Time: 12/18/17 10:35 AM  Result Value Ref Range   Vit D, 25-Hydroxy 24 (L) 30 - 100 ng/mL    Comment: Vitamin D Status         25-OH Vitamin D: . Deficiency:                    <20 ng/mL Insufficiency:             20 - 29 ng/mL Optimal:                 > or = 30 ng/mL . For 25-OH Vitamin D testing on patients on  D2-supplementation and patients for whom quantitation  of D2 and D3 fractions is required, the QuestAssureD(TM) 25-OH VIT D, (D2,D3), LC/MS/MS is recommended: order  code [id number] (patients >1468yrs). . For more information on this test, go to: http://education.[facility].com/faq/FAQ163 (This link is being provided for  informational/educational purposes only.)   CBC     Status: Abnormal   Collection Time: 12/18/17 10:39 AM  Result Value Ref Range   WBC 5.2 3.8 - 10.8 Thousand/uL   RBC 4.70 3.80 - 5.10 Million/uL   Hemoglobin 12.4 11.7 - 15.5 g/dL   HCT 60.438.4 54.035.0 - 98.145.0 %   MCV 81.7 80.0 - 100.0 fL   MCH 26.4 (L) 27.0 - 33.0 pg   MCHC 32.3 32.0 - 36.0 g/dL    RDW 19.113.0 47.811.0 - 29.515.0 %   Platelets 278 140 - 400 Thousand/uL   MPV 9.7 7.5 - 12.5 fL  COMPLETE METABOLIC PANEL WITH GFR     Status: None   Collection Time: 12/18/17 10:39 AM  Result Value Ref Range   Glucose, Bld 107 65 - 139 mg/dL    Comment: .        Non-fasting reference interval .    BUN 17 7 - 25 mg/dL   Creat 6.210.75 3.080.50 - 6.571.10 mg/dL   GFR, Est Non African American 99 >  OR = 60 mL/min/1.73m2   GFR, Est African American 115 > OR = 60 mL/min/1.67m2   BUN/Creatinine Ratio NOT APPLICABLE 6 - 22 (calc)   Sodium 138 135 - 146 mmol/L   Potassium 4.3 3.5 - 5.3 mmol/L   Chloride 104 98 - 110 mmol/L   CO2 28 20 - 32 mmol/L   Calcium 9.8 8.6 - 10.2 mg/dL   Total Protein 6.6 6.1 - 8.1 g/dL   Albumin 4.1 3.6 - 5.1 g/dL   Globulin 2.5 1.9 - 3.7 g/dL (calc)   AG Ratio 1.6 1.0 - 2.5 (calc)   Total Bilirubin 0.4 0.2 - 1.2 mg/dL   Alkaline phosphatase (APISO) 79 33 - 115 U/L   AST 15 10 - 30 U/L   ALT 16 6 - 29 U/L  Lipid panel     Status: Abnormal   Collection Time: 12/18/17 10:39 AM  Result Value Ref Range   Cholesterol 147 <200 mg/dL   HDL 45 (L) >40 mg/dL   Triglycerides 981 <191 mg/dL   LDL Cholesterol (Calc) 79 mg/dL (calc)    Comment: Reference range: <100 . Desirable range <100 mg/dL for primary prevention;   <70 mg/dL for patients with CHD or diabetic patients  with > or = 2 CHD risk factors. Marland Kitchen LDL-C is now calculated using the Martin-Hopkins  calculation, which is a validated novel method providing  better accuracy than the Friedewald equation in the  estimation of LDL-C.  Horald Pollen et al. Lenox Ahr. 4782;956(21): 2061-2068  (http://education.[facility].com/faq/FAQ164)    Total CHOL/HDL Ratio 3.3 <5.0 (calc)   Non-HDL Cholesterol (Calc) 102 <130 mg/dL (calc)    Comment: For patients with diabetes plus 1 major ASCVD risk  factor, treating to a non-HDL-C goal of <100 mg/dL  (LDL-C of <30 mg/dL) is considered a therapeutic  option.   TSH     Status: None    Collection Time: 12/18/17 10:39 AM  Result Value Ref Range   TSH 1.51 mIU/L    Comment:           Reference Range .           > or = 20 Years  0.40-4.50 .                Pregnancy Ranges           First trimester    0.26-2.66           Second trimester   0.55-2.73           Third trimester    0.43-2.91   Hemoglobin A1c     Status: Abnormal   Collection Time: 12/18/17 10:39 AM  Result Value Ref Range   Hgb A1c MFr Bld 5.8 (H) <5.7 % of total Hgb    Comment: For someone without known diabetes, a hemoglobin  A1c value between 5.7% and 6.4% is consistent with prediabetes and should be confirmed with a  follow-up test. . For someone with known diabetes, a value <7% indicates that their diabetes is well controlled. A1c targets should be individualized based on duration of diabetes, age, comorbid conditions, and other considerations. . This assay result is consistent with an increased risk of diabetes. . Currently, no consensus exists regarding use of hemoglobin A1c for diagnosis of diabetes for children. .    Mean Plasma Glucose 120 (calc)   eAG (mmol/L) 6.6 (calc)  Magnesium     Status: None   Collection Time: 12/18/17 10:39 AM  Result Value Ref Range  Magnesium 1.7 1.5 - 2.5 mg/dL    EKG interpretation: Rate: 81 Rhythm: sinus No ST/T changes concerning for acute ischemia/infarct  Previous EKG no tracings available to review       ASSESSMENT/PLAN: See patient printed instructions.  Palpitations - Plan: CBC, COMPLETE METABOLIC PANEL WITH GFR, Lipid panel, TSH, Hemoglobin A1c, VITAMIN D 25 Hydroxy (Vit-D Deficiency, Fractures), Magnesium, ECHOCARDIOGRAM COMPLETE, Holter monitor - 48 hour  Irregular heart beat - Plan: EKG 12-Lead, CBC, COMPLETE METABOLIC PANEL WITH GFR, Lipid panel, TSH, Hemoglobin A1c, VITAMIN D 25 Hydroxy (Vit-D Deficiency, Fractures), Magnesium, ECHOCARDIOGRAM COMPLETE, Holter monitor - 48 hour  Vaginal discharge - Plan: POCT Urinalysis  Dipstick, Urinalysis, microscopic only, Urine Culture, CBC, COMPLETE METABOLIC PANEL WITH GFR, Lipid panel, TSH, Hemoglobin A1c, VITAMIN D 25 Hydroxy (Vit-D Deficiency, Fractures), Magnesium  Stress incontinence  Urine frequency  Need for influenza vaccination - Plan: Flu Vaccine QUAD 6+ mos PF IM (Fluarix Quad PF)    Patient Instructions  Plan:  For irregular heartbeat, this sounds like you may be having intermittent tachycardia or possibly atrial fibrillation.  Let's get a Holter monitor and see if we can catch an episode on here, if not will need to send you to a cardiologist to discuss initiating a long-term monitor.  We will also get an echocardiogram to evaluate structure of the heart.  We will get some blood work as well.  If you experience serious chest pain, palpitations, especially if associated with dizziness or trouble breathing, please call 911.  For urinary issues, this sounds most likely cystocele/stress incontinence, which can also predispose people to frequent urinary tract infections.  Would work on [employer] exercises, emptying bladder every few hours whether or not you feel an urge to go to the bathroom, and would follow-up with OB/GYN for pelvic exam.  We will of course send urine for analysis to see if there is an infection, we will call you over the weekend if we need to initiate antibiotics.  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  Please let us know sooner if there are any questions or problems, and we will call you once we have results of your blood work back if there is anything urgent that requires follow-up sooner.        Visit summary with medication list and pertinent instructions was printed for patient to review. All questions at time of visit were answered - patient instructed  to contact office with any additional concerns. ER/RTC precautions were reviewed with the patient.   Follow-up plan: Return in about 4 weeks (around 01/15/2018) for review labs and other results - sooner if needed.    Please note: voice recognition software was used to produce this document, and typos may escape review. Please contact Dr. Lyn Hollingshead for any needed clarifications.

## 2017-12-19 LAB — CBC
HCT: 38.4 % (ref 35.0–45.0)
Hemoglobin: 12.4 g/dL (ref 11.7–15.5)
MCH: 26.4 pg — ABNORMAL LOW (ref 27.0–33.0)
MCHC: 32.3 g/dL (ref 32.0–36.0)
MCV: 81.7 fL (ref 80.0–100.0)
MPV: 9.7 fL (ref 7.5–12.5)
PLATELETS: 278 10*3/uL (ref 140–400)
RBC: 4.7 10*6/uL (ref 3.80–5.10)
RDW: 13 % (ref 11.0–15.0)
WBC: 5.2 10*3/uL (ref 3.8–10.8)

## 2017-12-19 LAB — LIPID PANEL
CHOL/HDL RATIO: 3.3 (calc) (ref <5.0)
CHOLESTEROL: 147 mg/dL (ref <200)
HDL: 45 mg/dL — AB (ref >50)
LDL CHOLESTEROL (CALC): 79 mg/dL
NON-HDL CHOLESTEROL (CALC): 102 mg/dL (ref <130)
Triglycerides: 136 mg/dL (ref <150)

## 2017-12-19 LAB — COMPLETE METABOLIC PANEL WITH GFR
AG Ratio: 1.6 (calc) (ref 1.0–2.5)
ALBUMIN MSPROF: 4.1 g/dL (ref 3.6–5.1)
ALKALINE PHOSPHATASE (APISO): 79 U/L (ref 33–115)
ALT: 16 U/L (ref 6–29)
AST: 15 U/L (ref 10–30)
BUN: 17 mg/dL (ref 7–25)
CALCIUM: 9.8 mg/dL (ref 8.6–10.2)
CO2: 28 mmol/L (ref 20–32)
CREATININE: 0.75 mg/dL (ref 0.50–1.10)
Chloride: 104 mmol/L (ref 98–110)
GFR, EST NON AFRICAN AMERICAN: 99 mL/min/{1.73_m2} (ref >=60)
GFR, Est African American: 115 mL/min/{1.73_m2} (ref >=60)
GLOBULIN: 2.5 g/dL (ref 1.9–3.7)
GLUCOSE: 107 mg/dL (ref 65–139)
Potassium: 4.3 mmol/L (ref 3.5–5.3)
SODIUM: 138 mmol/L (ref 135–146)
Total Bilirubin: 0.4 mg/dL (ref 0.2–1.2)
Total Protein: 6.6 g/dL (ref 6.1–8.1)

## 2017-12-19 LAB — HEMOGLOBIN A1C
Hgb A1c MFr Bld: 5.8 % of total Hgb — ABNORMAL HIGH (ref <5.7)
Mean Plasma Glucose: 120 (calc)
eAG (mmol/L): 6.6 (calc)

## 2017-12-19 LAB — TSH: TSH: 1.51 mIU/L

## 2017-12-19 LAB — MAGNESIUM: Magnesium: 1.7 mg/dL (ref 1.5–2.5)

## 2017-12-20 LAB — URINE CULTURE
MICRO NUMBER:: 91041714
SPECIMEN QUALITY: ADEQUATE

## 2017-12-20 LAB — URINALYSIS, MICROSCOPIC ONLY
BACTERIA UA: NONE SEEN /HPF
HYALINE CAST: NONE SEEN /LPF
WBC, UA: NONE SEEN /HPF (ref 0–5)

## 2017-12-23 LAB — VITAMIN B1: VITAMIN B1 (THIAMINE): 14 nmol/L (ref 8–30)

## 2017-12-23 LAB — RHEUMATOID FACTOR

## 2017-12-23 LAB — CK: CK TOTAL: 74 U/L (ref 29–143)

## 2017-12-23 LAB — SEDIMENTATION RATE: SED RATE: 6 mm/h (ref 0–20)

## 2017-12-23 LAB — HIGH SENSITIVITY CRP: hs-CRP: 5.9 mg/L — ABNORMAL HIGH

## 2017-12-23 LAB — IRON,TIBC AND FERRITIN PANEL
%SAT: 15 % (calc) — ABNORMAL LOW (ref 16–45)
Ferritin: 13 ng/mL — ABNORMAL LOW (ref 16–232)
IRON: 59 ug/dL (ref 40–190)
TIBC: 392 ug/dL (ref 250–450)

## 2017-12-23 LAB — FOLATE: FOLATE: 8.8 ng/mL

## 2017-12-23 LAB — ZINC: ZINC: 60 ug/dL (ref 60–130)

## 2017-12-23 LAB — ANA: ANA: NEGATIVE

## 2017-12-23 LAB — VITAMIN D 25 HYDROXY (VIT D DEFICIENCY, FRACTURES): Vit D, 25-Hydroxy: 24 ng/mL — ABNORMAL LOW (ref 30–100)

## 2017-12-24 ENCOUNTER — Ambulatory Visit: Payer: 59

## 2017-12-24 ENCOUNTER — Ambulatory Visit (HOSPITAL_BASED_OUTPATIENT_CLINIC_OR_DEPARTMENT_OTHER)
Admission: RE | Admit: 2017-12-24 | Discharge: 2017-12-24 | Disposition: A | Discharge status: Home / Self Care | Payer: 59 | Source: Ambulatory Visit | Attending: Osteopathic Medicine | Admitting: Osteopathic Medicine

## 2017-12-24 DIAGNOSIS — R002 Palpitations: Secondary | ICD-10-CM | POA: Insufficient documentation

## 2017-12-24 DIAGNOSIS — I10 Essential (primary) hypertension: Secondary | ICD-10-CM | POA: Insufficient documentation

## 2017-12-24 DIAGNOSIS — I499 Cardiac arrhythmia, unspecified: Secondary | ICD-10-CM

## 2017-12-24 NOTE — Progress Notes (Signed)
Pt has seen results on MyChart and message also sent for patient to call back if any questions.

## 2017-12-24 NOTE — Progress Notes (Signed)
  Echocardiogram 2D Echocardiogram has been performed.  Natalie Hopkins T [name] 12/24/2017, 2:31 PM

## 2018-11-01 ENCOUNTER — Ambulatory Visit (INDEPENDENT_AMBULATORY_CARE_PROVIDER_SITE_OTHER): Payer: 59

## 2018-11-01 ENCOUNTER — Encounter: Payer: Self-pay | Admitting: Osteopathic Medicine

## 2018-11-01 ENCOUNTER — Other Ambulatory Visit (HOSPITAL_COMMUNITY)
Admission: RE | Admit: 2018-11-01 | Discharge: 2018-11-01 | Disposition: A | Discharge status: Home / Self Care | Payer: 59 | Source: Ambulatory Visit | Attending: Osteopathic Medicine | Admitting: Osteopathic Medicine

## 2018-11-01 ENCOUNTER — Other Ambulatory Visit: Payer: Self-pay

## 2018-11-01 ENCOUNTER — Ambulatory Visit (INDEPENDENT_AMBULATORY_CARE_PROVIDER_SITE_OTHER): Payer: 59 | Admitting: Osteopathic Medicine

## 2018-11-01 VITALS — BP 137/70 | HR 72 | Temp 98.2°F | Wt 239.5 lb

## 2018-11-01 DIAGNOSIS — N926 Irregular menstruation, unspecified: Secondary | ICD-10-CM

## 2018-11-01 DIAGNOSIS — R102 Pelvic and perineal pain: Secondary | ICD-10-CM

## 2018-11-01 MED ORDER — ORTHO-NOVUM 1/35 (28) 1-35 MG-MCG PO TABS: ORAL_TABLET | ORAL | 1 refills | Status: DC

## 2018-11-01 MED ORDER — ONDANSETRON 8 MG PO TBDP: 8.0000 mg | ORAL_TABLET | Freq: Three times a day (TID) | ORAL | 3 refills | Status: DC | PRN

## 2018-11-01 NOTE — Patient Instructions (Addendum)
   Will trial medications to get bleeding to stop.  Will get ultrasound and labs to further evaluate.  Pap specimen obtained today for routine cervical cancer screening, results should be back within a week.    If any tests abnormal, will follow-up depending on results.  If we fix it with medications, and other testing is normal, nothing else to do!  If testing is normal but symptoms persist, we may have you see OBGYN to talk about next steps and/or surgical options.

## 2018-11-01 NOTE — Progress Notes (Signed)
HPI: Natalie Hopkins is a 43 y.o. female who  has a past medical history of Anal fissure, Anemia, Anxiety, Arthritis, Back pain, Depression, Diverticulosis, Gallstones, GERD (gastroesophageal reflux disease), Hypertension, Migraine, Multiple gastric ulcers, Pancreatitis, Sleep apnea, and Sleep apnea.  she presents to [facility] today, 11/01/18,  for chief complaint of:  Prolonged periods   Spotting started few weeks ago, has been fairly heavy bleeding past 5 days. Some cramping type pain on RLQ/R pelvic area earlier today. No fever/chills. Denies potential STD exposure.    Past Surgical History:  Procedure Laterality Date  . 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 CHOLECYSTECTOMY  2000  . ECTOPIC PREGNANCY SURGERY     twice, with removal of 1 tube  . TUBAL LIGATION      At today's visit 11/01/18 ... PMH, PSH, FH reviewed and updated as needed.  Current medication list and allergy/intolerance hx reviewed and updated as needed. (See remainder of HPI, ROS, Phys Exam below)   No results found.  No results found for this or any previous visit (from the past 72 hour(s)).        ASSESSMENT/PLAN: The primary encounter diagnosis was Prolonged periods. A diagnosis of Pelvic pain was also pertinent to this visit.   Orders Placed This Encounter  Procedures  . WET PREP FOR TRICH, YEAST, CLUE  . US Transvaginal Non-OB  . US Pelvis Complete  . CBC  . TSH  . FSH/LH     Meds ordered this encounter  Medications  . norethindrone-ethinyl estradiol 1/35 (ORTHO-NOVUM 1/35, 28,) tablet    Sig: 1 tablet po qid x 2 - 4 days until bleeding stops then 1 tablet tid x 7 days then 1 tablet bid x 2 days then 1 tablet daily x 3 weeks then Skip one week - allow withdrawal bleed then Cycle on pills for 2-3 months    Dispense:  3 Package    Refill:  1  . ondansetron (ZOFRAN-ODT) 8 MG disintegrating tablet    Sig: Take 1 tablet (8 mg total) by mouth  every 8 (eight) hours as needed for nausea.    Dispense:  20 tablet    Refill:  3    Patient Instructions   Will trial medications to get bleeding to stop.  Will get ultrasound and labs to further evaluate.  Pap specimen obtained today for routine cervical cancer screening, results should be back within a week.    If any tests abnormal, will follow-up depending on results.  If we fix it with medications, and other testing is normal, nothing else to do!  If testing is normal but symptoms persist, we may have you see OBGYN to talk about next steps and/or surgical options.      Follow-up plan: Return if symptoms worsen or fail to improve, for RECHECK PENDING RESULTS / IF WORSE OR CHANGE.                                                 ################################################# ################################################# ################################################# #################################################    Current Meds  Medication Sig  . Ascorbic Acid (VITAMIN C) 100 MG tablet Take 100 mg by mouth daily.  . Cholecalciferol (VITAMIN D-3 PO) Take by mouth.  . clonazePAM (KLONOPIN) 0.5 MG tablet Take 0.5 mg by mouth 3 (three) times daily as needed.  .Marland Kitchen  Dexlansoprazole (DEXILANT) 30 MG capsule Take 30 mg by mouth daily.  . fexofenadine (ALLEGRA) 30 MG tablet Take 30 mg by mouth 2 (two) times daily.  . fish oil-omega-3 fatty acids 1000 MG capsule Take 2 g by mouth daily.  Marland Kitchen FLUoxetine (PROZAC) 10 MG capsule Take 10 mg by mouth daily.  . fluticasone (FLONASE) 50 MCG/ACT nasal spray Place 2 sprays into the nose daily.  . Multiple Vitamin (MULTIVITAMIN) tablet Take 1 tablet by mouth daily.  . traZODone (DESYREL) 100 MG tablet Take 100 mg by mouth at bedtime.  . vitamin B-12 (CYANOCOBALAMIN) 100 MCG tablet Take 50 mcg by mouth daily.  . vitamin E 100 UNIT capsule Take 100 Units by mouth daily.  .  [DISCONTINUED] ondansetron (ZOFRAN ODT) 4 MG disintegrating tablet Take 1 tablet (4 mg total) by mouth every 8 (eight) hours as needed for nausea or vomiting.    No Known Allergies     Review of Systems:  Constitutional: No recent illness  Cardiac: No  chest pain,   Respiratory:  No  shortness of breath  Gastrointestinal: +abdominal pain, no change on bowel habits  Musculoskeletal: No new myalgia/arthralgia  Skin: No  Rash  Neurologic: No  weakness, No  Dizziness   Exam:  BP 137/70 (BP Location: Left Arm, Patient Position: Sitting, Cuff Size: Large)   Pulse 72   Temp 98.2 F (36.8 C) (Oral)   Wt 239 lb 8 oz (108.6 kg)   BMI 42.43 kg/m   Constitutional: VS see above. General Appearance: alert, well-developed, well-nourished, NAD  Eyes: Normal lids and conjunctive, non-icteric sclera  Neck: No masses, trachea midline.   Respiratory: Normal respiratory effort.   Musculoskeletal: Gait normal. Symmetric and independent movement of all extremities  Abdominal: non-tender, non-distended, no appreciable organomegaly, neg Murphy's  Neurological: Normal balance/coordination. No tremor.  Skin: warm, dry, intact.   Psychiatric: Normal judgment/insight. Normal mood and affect. Oriented x3.       Visit summary with medication list and pertinent instructions was printed for patient to review, patient was advised to alert [place] if any updates are needed. All questions at time of visit were answered - patient instructed to contact office with any additional concerns. ER/RTC precautions were reviewed with the patient and understanding verbalized.     Please note: voice recognition software was used to produce this document, and typos may escape review. Please contact Dr. Sheppard Coil for any needed clarifications.    Follow up plan: Return if symptoms worsen or fail to improve, for RECHECK PENDING RESULTS / IF WORSE OR CHANGE.

## 2018-11-02 LAB — WET PREP FOR TRICH, YEAST, CLUE
MICRO NUMBER:: 660239
Specimen Quality: ADEQUATE

## 2018-11-02 LAB — CBC
HCT: 34.3 % — ABNORMAL LOW (ref 35.0–45.0)
Hemoglobin: 11 g/dL — ABNORMAL LOW (ref 11.7–15.5)
MCH: 25.8 pg — ABNORMAL LOW (ref 27.0–33.0)
MCHC: 32.1 g/dL (ref 32.0–36.0)
MCV: 80.5 fL (ref 80.0–100.0)
MPV: 9.6 fL (ref 7.5–12.5)
Platelets: 267 10*3/uL (ref 140–400)
RBC: 4.26 10*6/uL (ref 3.80–5.10)
RDW: 13.6 % (ref 11.0–15.0)
WBC: 7.9 10*3/uL (ref 3.8–10.8)

## 2018-11-02 LAB — FSH/LH
FSH: 20.3 m[IU]/mL
LH: 5.1 m[IU]/mL

## 2018-11-02 LAB — TSH: TSH: 1.12 mIU/L

## 2018-11-03 ENCOUNTER — Ambulatory Visit: Payer: 59 | Admitting: Osteopathic Medicine

## 2018-11-03 LAB — CYTOLOGY - PAP
Diagnosis: NEGATIVE
HPV: NOT DETECTED

## 2019-07-19 ENCOUNTER — Ambulatory Visit: Payer: 59 | Attending: Internal Medicine

## 2019-07-19 ENCOUNTER — Other Ambulatory Visit: Payer: Self-pay

## 2019-07-19 DIAGNOSIS — Z20822 Contact with and (suspected) exposure to covid-19: Secondary | ICD-10-CM

## 2019-07-20 LAB — SARS-COV-2, NAA 2 DAY TAT

## 2019-07-20 LAB — NOVEL CORONAVIRUS, NAA: SARS-CoV-2, NAA: DETECTED — AB

## 2019-07-21 ENCOUNTER — Ambulatory Visit (HOSPITAL_COMMUNITY)
Admission: RE | Admit: 2019-07-21 | Discharge: 2019-07-21 | Disposition: A | Discharge status: Home / Self Care | Payer: 59 | Source: Ambulatory Visit | Attending: Pulmonary Disease | Admitting: Pulmonary Disease

## 2019-07-21 ENCOUNTER — Encounter: Payer: Self-pay | Admitting: Physician Assistant

## 2019-07-21 ENCOUNTER — Telehealth: Payer: Self-pay | Admitting: Physician Assistant

## 2019-07-21 ENCOUNTER — Other Ambulatory Visit: Payer: Self-pay | Admitting: Physician Assistant

## 2019-07-21 DIAGNOSIS — U071 COVID-19: Secondary | ICD-10-CM

## 2019-07-21 DIAGNOSIS — I10 Essential (primary) hypertension: Secondary | ICD-10-CM

## 2019-07-21 MED ORDER — DIPHENHYDRAMINE HCL 50 MG/ML IJ SOLN: 50.0000 mg | Freq: Once | INTRAMUSCULAR | Status: DC | PRN

## 2019-07-21 MED ORDER — METHYLPREDNISOLONE SODIUM SUCC 125 MG IJ SOLR: 125.0000 mg | Freq: Once | INTRAMUSCULAR | Status: DC | PRN

## 2019-07-21 MED ORDER — FAMOTIDINE IN NACL 20-0.9 MG/50ML-% IV SOLN: 20.0000 mg | Freq: Once | INTRAVENOUS | Status: DC | PRN

## 2019-07-21 MED ORDER — EPINEPHRINE 0.3 MG/0.3ML IJ SOAJ: 0.3000 mg | Freq: Once | INTRAMUSCULAR | Status: DC | PRN

## 2019-07-21 MED ORDER — SODIUM CHLORIDE 0.9 % IV SOLN: INTRAVENOUS | Status: DC | PRN

## 2019-07-21 MED ORDER — ALBUTEROL SULFATE HFA 108 (90 BASE) MCG/ACT IN AERS: 2.0000 | INHALATION_SPRAY | Freq: Once | RESPIRATORY_TRACT | Status: DC | PRN

## 2019-07-21 MED ORDER — SODIUM CHLORIDE 0.9 % IV SOLN
700.0000 mg | Freq: Once | INTRAVENOUS | Status: AC
  Administered 2019-07-21: 12:00:00 700 mg via INTRAVENOUS
  Filled 2019-07-21: qty 20

## 2019-07-21 NOTE — Progress Notes (Signed)
  Diagnosis: COVID-19  Physician:Dr. Delford Field   Procedure: [facility] Med: bamlanivimab infusion - Provided patient with bamlanimivab fact sheet for patients, parents and caregivers prior to infusion.  Complications: No immediate complications noted.  Discharge: Discharged home   Natalie Hopkins 07/21/2019

## 2019-07-21 NOTE — Discharge Instructions (Signed)

## 2019-07-21 NOTE — Progress Notes (Signed)
  I connected by phone with Natalie Hopkins on 07/21/2019 at 8:56 AM to discuss the potential use of an new treatment for mild to moderate COVID-19 viral infection in non-hospitalized patients.  This patient is a 44 y.o. female that meets the FDA criteria for Emergency Use Authorization of bamlanivimab or casirivimab\imdevimab.  Has a (+) direct SARS-CoV-2 viral test result  Has mild or moderate COVID-19   Is ? 43 years of age and weighs ? 40 kg  Is NOT hospitalized due to COVID-19  Is NOT requiring oxygen therapy or requiring an increase in baseline oxygen flow rate due to COVID-19  Is within 10 days of symptom onset  Has at least one of the high risk factor(s) for progression to severe COVID-19 and/or hospitalization as defined in EUA.  Specific high risk criteria : BMI >/= 35   I have spoken and communicated the following to the patient or parent/caregiver:  1. FDA has authorized the emergency use of bamlanivimab and casirivimab\imdevimab for the treatment of mild to moderate COVID-19 in adults and pediatric patients with positive results of direct SARS-CoV-2 viral testing who are 89 years of age and older weighing at least 40 kg, and who are at high risk for progressing to severe COVID-19 and/or hospitalization.  2. The significant known and potential risks and benefits of bamlanivimab and casirivimab\imdevimab, and the extent to which such potential risks and benefits are unknown.  3. Information on available alternative treatments and the risks and benefits of those alternatives, including clinical trials.  4. Patients treated with bamlanivimab and casirivimab\imdevimab should continue to self-isolate and use infection control measures (e.g., wear mask, isolate, social distance, avoid sharing personal items, clean and disinfect "high touch" surfaces, and frequent handwashing) according to CDC guidelines.   5. The patient or parent/caregiver has the option to accept or refuse  bamlanivimab or casirivimab\imdevimab .  After reviewing this information with the patient, The patient agreed to proceed with receiving the bamlanimivab infusion and will be provided a copy of the Fact sheet prior to receiving the infusion.Sharrell Ku [name] 07/21/2019 8:56 AM

## 2019-07-21 NOTE — Telephone Encounter (Signed)
Called to discuss with patient about Covid symptoms and the use of bamlanivimab or casirivimab/imdevimab, a monoclonal antibody infusion for those with mild to moderate Covid symptoms and at a high risk of hospitalization.  Pt is qualified for this infusion at the [facility] infusion center due to BMI>35   No phone # listed. I did sent a MyCHart message  Cline Crock PA-C  [facility]

## 2019-08-17 ENCOUNTER — Ambulatory Visit: Payer: 59 | Admitting: Osteopathic Medicine

## 2019-08-18 ENCOUNTER — Ambulatory Visit (INDEPENDENT_AMBULATORY_CARE_PROVIDER_SITE_OTHER): Payer: 59 | Admitting: Nurse Practitioner

## 2019-08-18 ENCOUNTER — Other Ambulatory Visit: Payer: Self-pay

## 2019-08-18 ENCOUNTER — Encounter: Payer: Self-pay | Admitting: Nurse Practitioner

## 2019-08-18 VITALS — BP 132/87 | HR 80 | Temp 98.0°F | Ht 63.0 in | Wt 245.1 lb

## 2019-08-18 DIAGNOSIS — Z114 Encounter for screening for human immunodeficiency virus [HIV]: Secondary | ICD-10-CM

## 2019-08-18 DIAGNOSIS — Z9109 Other allergy status, other than to drugs and biological substances: Secondary | ICD-10-CM | POA: Diagnosis not present

## 2019-08-18 DIAGNOSIS — F908 Attention-deficit hyperactivity disorder, other type: Secondary | ICD-10-CM

## 2019-08-18 DIAGNOSIS — H8113 Benign paroxysmal vertigo, bilateral: Secondary | ICD-10-CM

## 2019-08-18 MED ORDER — MECLIZINE HCL 25 MG PO TABS: 25.0000 mg | ORAL_TABLET | Freq: Three times a day (TID) | ORAL | 3 refills | Status: DC | PRN

## 2019-08-18 MED ORDER — LISDEXAMFETAMINE DIMESYLATE 40 MG PO CAPS: 40.0000 mg | ORAL_CAPSULE | ORAL | 0 refills | Status: DC

## 2019-08-18 MED ORDER — PREDNISONE 20 MG PO TABS: 20.0000 mg | ORAL_TABLET | Freq: Two times a day (BID) | ORAL | 0 refills | Status: DC

## 2019-08-18 NOTE — Patient Instructions (Signed)
Benign Positional Vertigo Vertigo is the feeling that you or your surroundings are moving when they are not. Benign positional vertigo is the most common form of vertigo. This is usually a harmless condition (benign). This condition is positional. This means that symptoms are triggered by certain movements and positions. This condition can be dangerous if it occurs while you are doing something that could cause harm to you or others. This includes activities such as driving or operating machinery. What are the causes? In many cases, the cause of this condition is not known. It may be caused by a disturbance in an area of the inner ear that helps your brain to sense movement and balance. This disturbance can be caused by:  Viral infection (labyrinthitis).  Head injury.  Repetitive motion, such as jumping, dancing, or running. What increases the risk? You are more likely to develop this condition if:  You are a woman.  You are 50 years of age or older. What are the signs or symptoms? Symptoms of this condition usually happen when you move your head or your eyes in different directions. Symptoms may start suddenly, and usually last for less than a minute. They include:  Loss of balance and falling.  Feeling like you are spinning or moving.  Feeling like your surroundings are spinning or moving.  Nausea and vomiting.  Blurred vision.  Dizziness.  Involuntary eye movement (nystagmus). Symptoms can be mild and cause only minor problems, or they can be severe and interfere with daily life. Episodes of benign positional vertigo may return (recur) over time. Symptoms may improve over time. How is this diagnosed? This condition may be diagnosed based on:  Your medical history.  Physical exam of the head, neck, and ears.  Tests, such as: ? MRI. ? CT scan. ? Eye movement tests. Your health care provider may ask you to change positions quickly while he or she watches you for symptoms  of benign positional vertigo, such as nystagmus. Eye movement may be tested with a variety of exams that are designed to evaluate or stimulate vertigo. ? An electroencephalogram (EEG). This records electrical activity in your brain. ? Hearing tests. You may be referred to a health care provider who specializes in ear, nose, and throat (ENT) problems (otolaryngologist) or a provider who specializes in disorders of the nervous system (neurologist). How is this treated?  This condition may be treated in a session in which your health care provider moves your head in specific positions to adjust your inner ear back to normal. Treatment for this condition may take several sessions. Surgery may be needed in severe cases, but this is rare. In some cases, benign positional vertigo may resolve on its own in 2-4 weeks. Follow these instructions at home: Safety  Move slowly. Avoid sudden body or head movements or certain positions, as told by your health care provider.  Avoid driving until your health care provider says it is safe for you to do so.  Avoid operating heavy machinery until your health care provider says it is safe for you to do so.  Avoid doing any tasks that would be dangerous to you or others if vertigo occurs.  If you have trouble walking or keeping your balance, try using a cane for stability. If you feel dizzy or unstable, sit down right away.  Return to your normal activities as told by your health care provider. Ask your health care provider what activities are safe for you. General instructions  Take over-the-counter   and prescription medicines only as told by your health care provider.  Drink enough fluid to keep your urine pale yellow.  Keep all follow-up visits as told by your health care provider. This is important. Contact a health care provider if:  You have a fever.  Your condition gets worse or you develop new symptoms.  Your family or friends notice any  behavioral changes.  You have nausea or vomiting that gets worse.  You have numbness or a "pins and needles" sensation. Get help right away if you:  Have difficulty speaking or moving.  Are always dizzy.  Faint.  Develop severe headaches.  Have weakness in your legs or arms.  Have changes in your hearing or vision.  Develop a stiff neck.  Develop sensitivity to light. Summary  Vertigo is the feeling that you or your surroundings are moving when they are not. Benign positional vertigo is the most common form of vertigo.  The cause of this condition is not known. It may be caused by a disturbance in an area of the inner ear that helps your brain to sense movement and balance.  Symptoms include loss of balance and falling, feeling that you or your surroundings are moving, nausea and vomiting, and blurred vision.  This condition can be diagnosed based on symptoms, physical exam, and other tests, such as MRI, CT scan, eye movement tests, and hearing tests.  Follow safety instructions as told by your health care provider. You will also be told when to contact your health care provider in case of problems. This information is not intended to replace advice given to you by your health care provider. Make sure you discuss any questions you have with your health care provider. Document Revised: 09/16/2017 Document Reviewed: 09/16/2017 [employer] Patient Education  2020 [employer]  

## 2019-08-18 NOTE — Progress Notes (Signed)
Acute Office Visit  Subjective:    Patient ID: Natalie Hopkins, female    DOB: [birth year], 44 y.o.   MRN: [id number]  Chief Complaint  Patient presents with  . Otalgia    chronic, feels like they are full of fluid, can hear fluttering and crackling, intermittent throbbing pain, decreased hearing in rt ear  . ADHD    we have never prescribed for her, increased problems with work, barely can function due to concentration, had old Rx of Vyvanse and took it the last 2 days with benefit, would like to restart Rx    HPI Patient is in today for discussion about restarting ADHD medications and evaluation for ear pressure and dizziness.   ADHD  Patient reports a significant history for ADHD with difficulty concentrating, difficulty completing tasks, and fidgeting. She has taken medication on and off for the past 10-15 years. She has been off of her medication for a couple of years at this time, but has recently been experiencing more distractions and stressors in her life, which have led her to be less attentive and focused. She feels that her work is suffering and finds herself not completing tasks, unable to focus, and more distracted easily. She did have an older prescription of Vyvanse left over from when she used to take the medication and she took the medication for the last two days with significantly helpful results. She would like to restart this medication today.  ADHD status: newly being seen in this practice for this- uncontrolled Satisfied with current therapy: no therapy- would like to restart Previous psychiatry evaluation: yes Previous medications: yes daytrana (methylphenidate) and vyvanse (lisdexamfethamine)   Taking meds on weekends/vacations: no longer taking, but would like to restart Work/school performance:  poor Difficulty sustaining attention/completing tasks: yes Distracted by extraneous stimuli: yes Does not listen when spoken to: yes  Fidgets with hands or feet: yes Unable  to stay in seat: occassionally Blurts out/interrupts others: no ADHD Medication Side Effects: not taking- in the past when taking none noted  DIZZINESS She reports a long history or allergy and sinus issues with previous sinus surgery. She reports these occasionally affect her ears. She reports symptoms of dizziness, occasion ringing in the ears, feeling of fullness in the ears bilaterally, increased pressure, and decreased/muffled hearing. She typically takes benadryl to help with these symptoms, but it is not helping at this time. She did start taking Claritin and Flonase about 7 days ago, which she feels has helped with her allergy symptoms, but continues to have the fullness and pressure in her ears and vertigo.   Duration: weeks Description of symptoms: room spinning Duration of episode: comes and goes- worst when first standing up in the morning Frequency of symptoms: recurrent Provoking factors: Allergy Aggravating factors:  Standing up and bending over Triggered by rolling over in bed: no Triggered by bending over: yes Aggravated by head movement: yes Aggravated by exertion, coughing, loud noises: no Recent head injury: no Recent or current viral symptoms: yes History of vasovagal episodes: no Nausea: yes Vomiting: no Tinnitus: occassional Hearing loss: yes Aural fullness: yes Headache: history of chronic headaches- no new onset Photophobia/phonophobia: no Unsteady gait: yes Postural instability: yes Diplopia, dysarthria, dysphagia or weakness: no Related to exertion: no Pallor: no Diaphoresis: no Dyspnea: no Chest pain: no   Past Medical History:  Diagnosis Date  . Anal fissure   . Anemia   . Anxiety   . Arthritis   . Back pain   . Depression   .  Diverticulosis    on CT  . Gallstones   . GERD (gastroesophageal reflux disease)   . Hypertension   . Migraine   . Morbid obesity ([place])   . Multiple gastric ulcers   . Pancreatitis   . Sleep apnea   . Sleep  apnea     Past Surgical History:  Procedure Laterality Date  . 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 CHOLECYSTECTOMY  2000  . ECTOPIC PREGNANCY SURGERY     twice, with removal of 1 tube  . TUBAL LIGATION      Family History  Problem Relation Age of Onset  . Asthma Father   . Bladder Cancer Mother   . Irregular heart beat Mother   . Breast cancer Maternal Grandmother   . Diabetes Paternal Grandfather   . Ovarian cancer Maternal Aunt     Social History   Socioeconomic History  . Marital status: Married    Spouse name: Not on file  . Number of children: 3  . Years of education: Not on file  . Highest education level: Not on file  Occupational History  . Occupation: Research officer, political party  Tobacco Use  . Smoking status: Never Smoker  . Smokeless tobacco: Never Used  Substance and Sexual Activity  . Alcohol use: Yes    Comment: once a month  . Drug use: No  . Sexual activity: Yes    Partners: Male    Birth control/protection: Surgical  Other Topics Concern  . Not on file  Social History Narrative  . Not on file   Social Determinants of Health   Financial Resource Strain:   . Difficulty of Paying Living Expenses:   Food Insecurity:   . Worried About Charity fundraiser in the Last Year:   . Arboriculturist in the Last Year:   Transportation Needs:   . Film/video editor (Medical):   Marland Kitchen Lack of Transportation (Non-Medical):   Physical Activity:   . Days of Exercise per Week:   . Minutes of Exercise per Session:   Stress:   . Feeling of Stress :   Social Connections:   . Frequency of Communication with Friends and Family:   . Frequency of Social Gatherings with Friends and Family:   . Attends Religious Services:   . Active Member of Clubs or Organizations:   . Attends Archivist Meetings:   Marland Kitchen Marital Status:   Intimate Partner Violence:   . Fear of Current or Ex-Partner:   . Emotionally Abused:   Marland Kitchen Physically Abused:   . Sexually  Abused:     Outpatient Medications Prior to Visit  Medication Sig Dispense Refill  . buPROPion (WELLBUTRIN XL) 150 MG 24 hr tablet Take 300 mg by mouth daily.    . Cholecalciferol (VITAMIN D-3 PO) Take by mouth.    . clonazePAM (KLONOPIN) 0.5 MG tablet Take 0.5 mg by mouth 3 (three) times daily as needed.    Marland Kitchen FLUoxetine (PROZAC) 10 MG capsule Take 10 mg by mouth daily.    . fluticasone (FLONASE) 50 MCG/ACT nasal spray Place 2 sprays into the nose daily.    Marland Kitchen loratadine (CLARITIN) 10 MG tablet Take 10 mg by mouth daily.    . Multiple Vitamin (MULTIVITAMIN) tablet Take 1 tablet by mouth daily.    . ondansetron (ZOFRAN-ODT) 8 MG disintegrating tablet Take 1 tablet (8 mg total) by mouth every 8 (eight) hours as needed for nausea. 20 tablet 3  .  traZODone (DESYREL) 100 MG tablet Take 100 mg by mouth at bedtime.    . vitamin B-12 (CYANOCOBALAMIN) 100 MCG tablet Take 50 mcg by mouth daily.    . Ascorbic Acid (VITAMIN C) 100 MG tablet Take 100 mg by mouth daily.    Marland Kitchen. Dexlansoprazole (DEXILANT) 30 MG capsule Take 30 mg by mouth daily.    . fexofenadine (ALLEGRA) 30 MG tablet Take 30 mg by mouth 2 (two) times daily.    . fish oil-omega-3 fatty acids 1000 MG capsule Take 2 g by mouth daily.    . vitamin E 100 UNIT capsule Take 100 Units by mouth daily.    . norethindrone-ethinyl estradiol 1/35 (ORTHO-NOVUM 1/35, 28,) tablet 1 tablet po qid x 2 - 4 days until bleeding stops then 1 tablet tid x 7 days then 1 tablet bid x 2 days then 1 tablet daily x 3 weeks then Skip one week - allow withdrawal bleed then Cycle on pills for 2-3 months (Patient not taking: Reported on 08/18/2019) 3 Package 1   No facility-administered medications prior to visit.    No Known Allergies  Review of Systems  Constitutional: Negative for chills, fatigue and fever.  HENT: Positive for congestion, hearing loss, postnasal drip, rhinorrhea, sinus pressure, sinus pain, sneezing and sore throat. Negative for ear discharge, ear  pain, facial swelling, nosebleeds and trouble swallowing.   Eyes: Positive for itching.  Respiratory: Negative for cough, chest tightness, shortness of breath and wheezing.   Cardiovascular: Negative for chest pain, palpitations and leg swelling.  Gastrointestinal: Negative for abdominal pain, constipation, diarrhea, nausea and vomiting.  Skin: Negative for color change and pallor.  Allergic/Immunologic: Positive for environmental allergies.  Neurological: Positive for dizziness and headaches. Negative for tremors, seizures, syncope, facial asymmetry, speech difficulty, weakness, light-headedness and numbness.  Hematological: Negative for adenopathy.  Psychiatric/Behavioral: Positive for decreased concentration. Negative for confusion and sleep disturbance. The patient is hyperactive. The patient is not nervous/anxious.        Objective:    Physical Exam Constitutional:      Appearance: Normal appearance.  HENT:     Head: Normocephalic.     Right Ear: No decreased hearing noted. No laceration, drainage, swelling or tenderness. A middle ear effusion is present. No mastoid tenderness. Tympanic membrane is bulging. Tympanic membrane is not injected, perforated or erythematous.     Left Ear: No decreased hearing noted. No laceration, drainage, swelling or tenderness. A middle ear effusion is present. No mastoid tenderness. Tympanic membrane is bulging. Tympanic membrane is not injected, perforated or erythematous.     Nose: Nose normal.     Mouth/Throat:     Mouth: Mucous membranes are moist.     Pharynx: Oropharyngeal exudate present. No posterior oropharyngeal erythema.  Eyes:     Extraocular Movements: Extraocular movements intact.     Conjunctiva/sclera: Conjunctivae normal.     Pupils: Pupils are equal, round, and reactive to light.  Cardiovascular:     Rate and Rhythm: Normal rate and regular rhythm.     Pulses: Normal pulses.     Heart sounds: Normal heart sounds.  Pulmonary:      Effort: Pulmonary effort is normal.     Breath sounds: Normal breath sounds.  Abdominal:     General: Abdomen is flat. Bowel sounds are normal.     Palpations: Abdomen is soft.  Musculoskeletal:        General: Normal range of motion.     Right lower leg: No edema.  Left lower leg: No edema.  Skin:    General: Skin is warm and dry.     Capillary Refill: Capillary refill takes less than 2 seconds.  Neurological:     General: No focal deficit present.     Mental Status: She is alert and oriented to person, place, and time.     Motor: No weakness.     Coordination: Coordination normal.     Gait: Gait normal.  Psychiatric:        Attention and Perception: Attention and perception normal.        Mood and Affect: Mood normal.        Speech: Speech normal.        Behavior: Behavior normal.        Thought Content: Thought content normal.        Cognition and Memory: Cognition and memory normal.        Judgment: Judgment normal.     BP 132/87   Pulse 80   Temp 98 F (36.7 C) (Oral)   Ht 5\' 3"  (1.6 m)   Wt 245 lb 1.6 oz (111.2 kg)   LMP 08/14/2019   SpO2 97%   BMI 43.42 kg/m  Wt Readings from Last 3 Encounters:  08/18/19 245 lb 1.6 oz (111.2 kg)  11/01/18 239 lb 8 oz (108.6 kg)  12/18/17 230 lb 1.6 oz (104.4 kg)    Health Maintenance Due  Topic Date Due  . HIV Screening  Never done    There are no preventive care reminders to display for this patient.   Lab Results  Component Value Date   TSH 1.12 11/01/2018   Lab Results  Component Value Date   WBC 7.9 11/01/2018   HGB 11.0 (L) 11/01/2018   HCT 34.3 (L) 11/01/2018   MCV 80.5 11/01/2018   PLT 267 11/01/2018   Lab Results  Component Value Date   NA 138 12/18/2017   K 4.3 12/18/2017   CO2 28 12/18/2017   GLUCOSE 107 12/18/2017   BUN 17 12/18/2017   CREATININE 0.75 12/18/2017   BILITOT 0.4 12/18/2017   ALKPHOS 57 06/10/2016   AST 15 12/18/2017   ALT 16 12/18/2017   PROT 6.6 12/18/2017   ALBUMIN 3.9  06/10/2016   CALCIUM 9.8 12/18/2017   ANIONGAP 7 06/10/2016   Lab Results  Component Value Date   CHOL 147 12/18/2017   Lab Results  Component Value Date   HDL 45 (L) 12/18/2017   Lab Results  Component Value Date   LDLCALC 79 12/18/2017   Lab Results  Component Value Date   TRIG 136 12/18/2017   Lab Results  Component Value Date   CHOLHDL 3.3 12/18/2017   Lab Results  Component Value Date   HGBA1C 5.8 (H) 12/18/2017       Assessment & Plan:   1. BPPV (benign paroxysmal positional vertigo), bilateral Symptoms and presentation of benign paroxysmal positional vertigo exacerbated by seasonal allergy symptoms.  Conservative treatment with over-the-counter antihistamines has been unsuccessful. We will trial meclizine 3 times a day for dizziness and nausea.  We will also trial a short burst of prednisone to help reduce inflammation. Patient provided with vestibular rehab maneuvers to perform on her own at home. If symptoms have not improved or worsen plan to send patient for formal vestibular rehabilitation. Patient to follow-up if symptoms worsen or fail to improve. - meclizine (ANTIVERT) 25 MG tablet; Take 1 tablet (25 mg total) by mouth 3 (three) times daily as  needed for dizziness or nausea.  Dispense: 30 tablet; Refill: 3 - predniSONE (DELTASONE) 20 MG tablet; Take 1 tablet (20 mg total) by mouth 2 (two) times daily with a meal. Take with breakfast and lunch to avoid interruption with your sleep.  Dispense: 10 tablet; Refill: 0  2. Allergy to environmental factors Symptoms and presentation consistent with allergies due to environmental factors.  Patient has recently started over-the-counter Claritin and Flonase.  Encouraged the patient to continue this medication for at least the next 2 weeks to see if this helps with her allergy symptoms and vertigo symptoms associated with fluid in the inner ears. I did instruct the patient if this is not effective we may trial daily  Singulair to see if this is more effective. Patient to follow-up if symptoms worsen or fail to improve.  3. ADHD, adult residual type Longstanding history of adult ADHD with intermittent stimulant usage to help with some symptoms when exacerbated. Plan to restart patient on Vyvanse 40 mg every morning to see if this helps improve concentration and work performance as this has been successful for her in the past. Patient to notify the office if she begins to have difficulty sleeping, chest pain, palpitations, or any other adverse effects from the medication. Follow-up in approximately 3 months for ADHD follow-up and prescription management. - lisdexamfetamine (VYVANSE) 40 MG capsule; Take 1 capsule (40 mg total) by mouth every morning. Script 1/3  Dispense: 30 capsule; Refill: 0 - lisdexamfetamine (VYVANSE) 40 MG capsule; Take 1 capsule (40 mg total) by mouth every morning. Script 2/3  Dispense: 30 capsule; Refill: 0 - lisdexamfetamine (VYVANSE) 40 MG capsule; Take 1 capsule (40 mg total) by mouth every morning. Script 3/3- please call office to schedule follow-up  Dispense: 30 capsule; Refill: 0  4. Screening for HIV (human immunodeficiency virus) Routine one-time screen for HIV provided. - HIV Antibody (routine testing w rflx)  Return in about 3 months (around 11/17/2019) for ADHD- may be virtual.   Tollie Eth, NP

## 2019-08-29 ENCOUNTER — Other Ambulatory Visit: Payer: Self-pay | Admitting: Nurse Practitioner

## 2019-08-29 ENCOUNTER — Encounter: Payer: Self-pay | Admitting: Nurse Practitioner

## 2019-08-29 DIAGNOSIS — F32A Depression, unspecified: Secondary | ICD-10-CM

## 2019-08-29 DIAGNOSIS — F329 Major depressive disorder, single episode, unspecified: Secondary | ICD-10-CM

## 2019-08-29 DIAGNOSIS — F319 Bipolar disorder, unspecified: Secondary | ICD-10-CM

## 2019-08-29 DIAGNOSIS — J3089 Other allergic rhinitis: Secondary | ICD-10-CM

## 2019-08-29 MED ORDER — MONTELUKAST SODIUM 10 MG PO TABS: 10.0000 mg | ORAL_TABLET | Freq: Every day | ORAL | 3 refills | Status: DC

## 2019-09-22 ENCOUNTER — Ambulatory Visit: Payer: 59 | Admitting: Nurse Practitioner

## 2019-11-10 ENCOUNTER — Encounter: Payer: Self-pay | Admitting: Nurse Practitioner

## 2019-11-10 ENCOUNTER — Telehealth (INDEPENDENT_AMBULATORY_CARE_PROVIDER_SITE_OTHER): Payer: 59 | Admitting: Nurse Practitioner

## 2019-11-10 DIAGNOSIS — F411 Generalized anxiety disorder: Secondary | ICD-10-CM

## 2019-11-10 DIAGNOSIS — F331 Major depressive disorder, recurrent, moderate: Secondary | ICD-10-CM

## 2019-11-10 DIAGNOSIS — F908 Attention-deficit hyperactivity disorder, other type: Secondary | ICD-10-CM | POA: Diagnosis not present

## 2019-11-10 DIAGNOSIS — F319 Bipolar disorder, unspecified: Secondary | ICD-10-CM | POA: Diagnosis not present

## 2019-11-10 MED ORDER — LISDEXAMFETAMINE DIMESYLATE 40 MG PO CAPS: 40.0000 mg | ORAL_CAPSULE | ORAL | 0 refills | Status: DC

## 2019-11-10 MED ORDER — BUPROPION HCL ER (XL) 150 MG PO TB24: ORAL_TABLET | ORAL | 3 refills | Status: DC

## 2019-11-10 NOTE — Patient Instructions (Signed)
I have refilled your Wellbutrin prescription as well as sent three months of Vyvanse to the pharmacy for you. Please schedule a follow-up appointment when you fill your last Vyvanse so we can keep you up to date.   I sent the referral to psychiatry in [place]. If you do not hear from someone in the next week, please let me know.

## 2019-11-10 NOTE — Progress Notes (Signed)
Virtual Video Visit via MyChart Note  I connected with  Natalie Hopkins on 11/10/19 at  1:30 PM EDT by the video enabled telemedicine application for , MyChart, and verified that I am speaking with the correct person using two identifiers.   I introduced myself as a Publishing rights manager with the practice. We discussed the limitations of evaluation and management by telemedicine and the availability of in person appointments. The patient expressed understanding and agreed to proceed.  The patient is: in her car  I am: in the office  Subjective:    CC:  Chief Complaint  Patient presents with  . ADHD    HPI: Natalie Hopkins is a 44 y.o. y/o female presenting via MyChart today for ADHD follow-up.   ADHD FOLLOW UP ADHD status: controlled Satisfied with current therapy: yes Medication compliance:  excellent compliance Previous psychiatry evaluation: yes Taking meds on weekends/vacations: occasionally Work/school performance:  excellent Difficulty sustaining attention/completing tasks: no Distracted by extraneous stimuli: no Does not listen when spoken to: no  Fidgets with hands or feet: no Unable to stay in seat: no Blurts out/interrupts others: no ADHD Medication Side Effects: no    Decreased appetite: no    Headache: no    Sleeping disturbance pattern: no    Irritability: no    Rebound effects (worse than baseline) off medication: no    Anxiousness: no    Dizziness: no    Tics: no     Past medical history, Surgical history, Family history not pertinant except as noted below, Social history, Allergies, and medications have been entered into the medical record, reviewed, and corrections made.   Review of Systems:  See HPI for pertinent positive and negatives  Objective:    General: Speaking clearly in complete sentences without any shortness of breath.   Alert and oriented x3.   Normal judgment.  No apparent acute distress.   Impression and Recommendations:    1.  ADHD, adult residual type Patient is doing very well on current dose of Vyvanse for ADHD management. No undesired side effects noted from medication. She reports significant improvement with focus while at work. She did run out of bupropion for about 2 weeks- when she went to refill the prescription, the pharmacist told her to contact her provider rather than restarting at the higher dose. We will restart this today at 150mg  and increase to 300mg  after 7 days. She had previously requested a referral to psychiatry and counseling services, but was never contacted. A new referral will be placed today.  PDMP reviewed for this visit.   PLAN - lisdexamfetamine (VYVANSE) 40 MG capsule; Take 1 capsule (40 mg total) by mouth every morning. Script 1/3  Dispense: 30 capsule; Refill: 0 - lisdexamfetamine (VYVANSE) 40 MG capsule; Take 1 capsule (40 mg total) by mouth every morning. Script 2/3  Dispense: 30 capsule; Refill: 0 - lisdexamfetamine (VYVANSE) 40 MG capsule; Take 1 capsule (40 mg total) by mouth every morning. Script 3/3- please call office to schedule follow-up  Dispense: 30 capsule; Refill: 0 - buPROPion (WELLBUTRIN XL) 150 MG 24 hr tablet; Take 1 tab (150mg ) by mouth daily for the first 7 days then increase to full dose of 2 tabs (300mg ) by mouth daily.  Dispense: 60 tablet; Refill: 3 - Referral for counseling and psychiatry in [facility] to Psychiatry  2. GAD (generalized anxiety disorder) 3. Bipolar affective disorder, remission status unspecified (HCC) 4. MDD (major depressive disorder), recurrent episode, moderate (HCC) Currently taking Prozac 20mg  daily and  trazodone as needed for sleep. Restarting Wellbutrin today. She has historically been on PRN clonazepam, but is doing well without this medication for the time being. Will hold off on refill today. Referral to psychiatry and counseling services in the [place] area placed.   - Ambulatory referral to Psychiatry   I discussed the  assessment and treatment plan with the patient. The patient was provided an opportunity to ask questions and all were answered. The patient agreed with the plan and demonstrated an understanding of the instructions.   The patient was advised to call back or seek an in-person evaluation if the symptoms worsen or if the condition fails to improve as anticipated.  I provided 20 minutes of non-face-to-face interaction with this MYCHART visit including intake, same-day documentation, and chart review.   Tollie Eth, NP

## 2019-12-20 ENCOUNTER — Encounter: Payer: Self-pay | Admitting: Nurse Practitioner

## 2020-02-16 ENCOUNTER — Encounter: Payer: Self-pay | Admitting: Nurse Practitioner

## 2020-02-16 ENCOUNTER — Ambulatory Visit (INDEPENDENT_AMBULATORY_CARE_PROVIDER_SITE_OTHER): Payer: No Typology Code available for payment source | Admitting: Nurse Practitioner

## 2020-02-16 VITALS — BP 136/84 | HR 88 | Ht 63.0 in | Wt 230.0 lb

## 2020-02-16 DIAGNOSIS — N3946 Mixed incontinence: Secondary | ICD-10-CM

## 2020-02-16 DIAGNOSIS — Z6841 Body Mass Index (BMI) 40.0 and over, adult: Secondary | ICD-10-CM

## 2020-02-16 DIAGNOSIS — N912 Amenorrhea, unspecified: Secondary | ICD-10-CM | POA: Diagnosis not present

## 2020-02-16 LAB — POCT URINALYSIS DIP (CLINITEK)
Bilirubin, UA: NEGATIVE
Blood, UA: NEGATIVE
Glucose, UA: NEGATIVE mg/dL
Ketones, POC UA: NEGATIVE mg/dL
Leukocytes, UA: NEGATIVE
Nitrite, UA: NEGATIVE
POC PROTEIN,UA: NEGATIVE
Spec Grav, UA: >=1.03 — AB (ref 1.010–1.025)
Urobilinogen, UA: 0.2 E.U./dL
pH, UA: 5.5 (ref 5.0–8.0)

## 2020-02-16 LAB — POCT URINE PREGNANCY: Preg Test, Ur: NEGATIVE

## 2020-02-16 NOTE — Progress Notes (Signed)
Acute Office Visit  Subjective:    Patient ID: Natalie Hopkins, female    DOB: 08-23-1975, 44 y.o.   MRN: [id number]  Chief Complaint  Patient presents with   Urinary Incontinence   Amenorrhea    HPI Natalie Hopkins is a 44 year old female presenting today for concerns with urinary incontinence and amenorrhea.  She reports that she has had stress incontinence for years noted mostly when coughing, jumping, sneezing.  Up until this time it has just been mildly bothersome.  She reports in the last few months she notices that immediately after urination after standing up she will experience urine leakage.  She is also experiencing more symptoms of stress incontinence than she had in the past.  She also has significant urge to urinate and tells me when the urge arises that she must get to the restroom quickly.  She denies bladder spasms, low back pain, fevers, chills, nausea, vomiting, diarrhea.   She is also experiencing amenorrhea.  She reports that her last period was in August of this year.  She reports up until that time her periods were fairly regular however she had noticed over the last couple years that they were increasing in volume specifically on the first 2 days of her menstrual cycle.  She is currently sexually active but had a tubal ligation about 8 to 10 years ago.  She does report that she has been experiencing hot flashes for several years now but she has in the most recent months developed a new symptom of flushing where she describes that the heat generates inside her body and causes of redness to her face and chest and you can feel the heat radiating off of her.  She also reports tenderness in the area where her ovaries are located and an increase in vaginal discharge that is clear in color.  She reports that the consistency of the discharge appears to be a little thicker than it has been in the past.  She endorses sleeping more during the day and having difficulty sleeping at night with  tossing and turning.  She denies getting up to use the bathroom during the night.  She reports that she has woke up in a sweat a few times.  She also endorses intermittent palpitations and mood changes.   She also tells me she has not had basic labs in quite some time and would like to have those drawn today while she is here.  Past Medical History:  Diagnosis Date   Anal fissure    Anemia    Anxiety    Arthritis    Back pain    Depression    Diverticulosis    on CT   Gallstones    GERD (gastroesophageal reflux disease)    Hypertension    Migraine    Morbid obesity (HCC)    Multiple gastric ulcers    Pancreatitis    Sleep apnea    Sleep apnea     Past Surgical History:  Procedure Laterality Date   BARIATRIC SURGERY  2014   Sleeve Gsatrectomy   CHOLECYSTECTOMY  2000   ECTOPIC PREGNANCY SURGERY     twice, with removal of 1 tube   TUBAL LIGATION      Family History  Problem Relation Age of Onset   Asthma Father    Bladder Cancer Mother    Irregular heart beat Mother    Breast cancer Maternal Grandmother    Diabetes Paternal Grandfather    Ovarian cancer Maternal Aunt  Social History   Socioeconomic History   Marital status: Married    Spouse name: Not on file   Number of children: 3   Years of education: Not on file   Highest education level: Not on file  Occupational History   Occupation: insurance billing  Tobacco Use   Smoking status: Never Smoker   Smokeless tobacco: Never Used  Building services engineerVaping Use   Vaping Use: Never used  Substance and Sexual Activity   Alcohol use: Yes    Comment: once a month   Drug use: No   Sexual activity: Yes    Partners: Male    Birth control/protection: Surgical  Other Topics Concern   Not on file  Social History Narrative   Not on file   Social Determinants of Health   Financial Resource Strain:    Difficulty of Paying Living Expenses: Not on file  Food Insecurity:    Worried  About Programme researcher, broadcasting/film/videounning Out of Food in the Last Year: Not on file   [employer] Out of Food in the Last Year: Not on file  Transportation Needs:    Lack of Transportation (Medical): Not on file   Lack of Transportation (Non-Medical): Not on file  Physical Activity:    Days of Exercise per Week: Not on file   Minutes of Exercise per Session: Not on file  Stress:    Feeling of Stress : Not on file  Social Connections:    Frequency of Communication with Friends and Family: Not on file   Frequency of Social Gatherings with Friends and Family: Not on file   Attends Religious Services: Not on file   Active Member of Clubs or Organizations: Not on file   Attends BankerClub or Organization Meetings: Not on file   Marital Status: Not on file  Intimate Partner Violence:    Fear of Current or Ex-Partner: Not on file   Emotionally Abused: Not on file   Physically Abused: Not on file   Sexually Abused: Not on file    Outpatient Medications Prior to Visit  Medication Sig Dispense Refill   buPROPion (WELLBUTRIN XL) 150 MG 24 hr tablet Take 1 tab (150mg ) by mouth daily for the first 7 days then increase to full dose of 2 tabs (300mg ) by mouth daily. 60 tablet 3   Dexlansoprazole (DEXILANT) 30 MG capsule Take 30 mg by mouth daily.     fexofenadine (ALLEGRA) 30 MG tablet Take 30 mg by mouth 2 (two) times daily.     FLUoxetine (PROZAC) 10 MG capsule Take 10 mg by mouth daily.     lisdexamfetamine (VYVANSE) 40 MG capsule Take 1 capsule (40 mg total) by mouth every morning. Script 1/3 30 capsule 0   lisdexamfetamine (VYVANSE) 40 MG capsule Take 1 capsule (40 mg total) by mouth every morning. Script 2/3 30 capsule 0   lisdexamfetamine (VYVANSE) 40 MG capsule Take 1 capsule (40 mg total) by mouth every morning. Script 3/3- please call office to schedule follow-up 30 capsule 0   montelukast (SINGULAIR) 10 MG tablet Take 1 tablet (10 mg total) by mouth at bedtime. 30 tablet 3   ondansetron (ZOFRAN-ODT) 8  MG disintegrating tablet Take 1 tablet (8 mg total) by mouth every 8 (eight) hours as needed for nausea. 20 tablet 3   traZODone (DESYREL) 100 MG tablet Take 100 mg by mouth at bedtime.     vitamin B-12 (CYANOCOBALAMIN) 100 MCG tablet Take 50 mcg by mouth daily.     Ascorbic Acid (VITAMIN C) 100  MG tablet Take 100 mg by mouth daily.     Cholecalciferol (VITAMIN D-3 PO) Take by mouth.     fish oil-omega-3 fatty acids 1000 MG capsule Take 2 g by mouth daily.     Multiple Vitamin (MULTIVITAMIN) tablet Take 1 tablet by mouth daily.     vitamin E 100 UNIT capsule Take 100 Units by mouth daily.     No facility-administered medications prior to visit.    No Known Allergies  Review of Systems All review of systems negative except for what is listed in HPI    Objective:    Physical Exam Vitals and nursing note reviewed.  Constitutional:      Appearance: Normal appearance. She is obese.  HENT:     Head: Normocephalic.  Eyes:     Extraocular Movements: Extraocular movements intact.     Conjunctiva/sclera: Conjunctivae normal.     Pupils: Pupils are equal, round, and reactive to light.  Cardiovascular:     Rate and Rhythm: Normal rate and regular rhythm.     Pulses: Normal pulses.     Heart sounds: Normal heart sounds.  Pulmonary:     Effort: Pulmonary effort is normal.     Breath sounds: Normal breath sounds.  Abdominal:     General: Abdomen is flat. Bowel sounds are normal. There is no distension.     Palpations: Abdomen is soft.     Tenderness: There is abdominal tenderness. There is no right CVA tenderness, left CVA tenderness, guarding or rebound.     Comments: Tenderness on palpation noted in the left lower and right lower quadrants.  No rebound or masses noted. No suprapubic tenderness noted.  Musculoskeletal:     Cervical back: Normal range of motion.     Right lower leg: No edema.     Left lower leg: No edema.  Skin:    General: Skin is warm and dry.     Capillary  Refill: Capillary refill takes less than 2 seconds.  Neurological:     General: No focal deficit present.     Mental Status: She is alert and oriented to person, place, and time.  Psychiatric:        Mood and Affect: Mood normal.        Behavior: Behavior normal.        Thought Content: Thought content normal.        Judgment: Judgment normal.     BP 136/84    Pulse 88    Ht 5\' 3"  (1.6 m)    Wt 230 lb (104.3 kg)    SpO2 99%    BMI 40.74 kg/m  Wt Readings from Last 3 Encounters:  02/16/20 230 lb (104.3 kg)  08/18/19 245 lb 1.6 oz (111.2 kg)  11/01/18 239 lb 8 oz (108.6 kg)    Health Maintenance Due  Topic Date Due   Hepatitis C Screening  Never done   COVID-19 Vaccine (1) Never done   INFLUENZA VACCINE  11/20/2019    There are no preventive care reminders to display for this patient.   Lab Results  Component Value Date   TSH 1.12 11/01/2018   Lab Results  Component Value Date   WBC 7.9 11/01/2018   HGB 11.0 (L) 11/01/2018   HCT 34.3 (L) 11/01/2018   MCV 80.5 11/01/2018   PLT 267 11/01/2018   Lab Results  Component Value Date   NA 138 12/18/2017   K 4.3 12/18/2017   CO2 28 12/18/2017  GLUCOSE 107 12/18/2017   BUN 17 12/18/2017   CREATININE 0.75 12/18/2017   BILITOT 0.4 12/18/2017   ALKPHOS 57 06/10/2016   AST 15 12/18/2017   ALT 16 12/18/2017   PROT 6.6 12/18/2017   ALBUMIN 3.9 06/10/2016   CALCIUM 9.8 12/18/2017   ANIONGAP 7 06/10/2016   Lab Results  Component Value Date   CHOL 147 12/18/2017   Lab Results  Component Value Date   HDL 45 (L) 12/18/2017   Lab Results  Component Value Date   LDLCALC 79 12/18/2017   Lab Results  Component Value Date   TRIG 136 12/18/2017   Lab Results  Component Value Date   CHOLHDL 3.3 12/18/2017   Lab Results  Component Value Date   HGBA1C 5.8 (H) 12/18/2017       Assessment & Plan:   Problem List Items Addressed This Visit      Other   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  At this time it is unclear if this is due to changes in the pelvic floor muscle and body habitus or possibly of hormonal etiology given her recent amenorrhea. Recommend pelvic floor exercises and double voiding for the next 4 to 6 weeks to see if this helps with improvement of her symptoms. Patient provided with a long to note when she is experiencing symptoms of incontinence. We will check labs today to monitor for thyroid dysfunction and diabetes. Urinalysis negative today for pregnancy or infection. We will plan follow-up based on laboratory results.      Relevant Orders   POCT URINALYSIS DIP (CLINITEK) (Completed)   TSH   Hemoglobin A1c   US Abdomen Limited   Amenorrhea    Amenorrhea and 44 year old female with a history of tubal ligation. Urine pregnancy test negative today. Given the other symptoms that are present it is possible that she is going through Tyrihanna Wingert menopause however, at this time we cannot rule out other etiologies. We will plan to get labs today to monitor prolactin, FSH, and TSH.  We will also plan for pelvic ultrasound. Discussed with the patient that the symptoms of incontinence along with amenorrhea could indicate possible menopause.  If it is found that she is in menopause we can consider a very low-dose small amount of topical estrogen to see if this helps with her urinary symptoms. We will make changes to plan of care and determine follow-up based on laboratory and imaging results.      Relevant Orders   POCT urine pregnancy (Completed)   TSH   Follicle stimulating hormone   Prolactin   CBC with Differential/Platelet   COMPLETE METABOLIC PANEL WITH GFR   Hemoglobin A1c   US Abdomen Limited   Body mass index (BMI) of 40.1-44.9 in adult (HCC)    BMI of 40.74 today.  It is possible the body habitus could be contributing to the urinary incontinence symptoms that the patient is  experiencing.   Discussed with patient pelvic floor exercises that may help with her current symptoms however weight loss may be helpful for this condition as well.      Relevant Orders   TSH   Follicle stimulating hormone   Prolactin   CBC with Differential/Platelet   COMPLETE METABOLIC PANEL WITH GFR   Lipid panel   Hemoglobin A1c      Will determine follow-up based on laboratory and imaging results. Will consider urology referral  if etiology remains unclear and pelvic floor exercises are unsuccessful after 4 to 6 weeks.  Tollie Eth, NP

## 2020-02-16 NOTE — Patient Instructions (Signed)
Menopause Menopause is the normal time of life when menstrual periods stop completely. It is usually confirmed by 12 months without a menstrual period. The transition to menopause (perimenopause) most often happens between the ages of 35 and 66. During perimenopause, hormone levels change in your body, which can cause symptoms and affect your health. Menopause may increase your risk for:  Loss of bone (osteoporosis), which causes bone breaks (fractures).  Depression.  Hardening and narrowing of the arteries (atherosclerosis), which can cause heart attacks and strokes. What are the causes? This condition is usually caused by a natural change in hormone levels that happens as you get older. The condition may also be caused by surgery to remove both ovaries (bilateral oophorectomy). What increases the risk? This condition is more likely to start at an earlier age if you have certain medical conditions or treatments, including:  A tumor of the pituitary gland in the brain.  A disease that affects the ovaries and hormone production.  Radiation treatment for cancer.  Certain cancer treatments, such as chemotherapy or hormone (anti-estrogen) therapy.  Heavy smoking and excessive alcohol use.  Family history of Natalie Hopkins menopause. This condition is also more likely to develop earlier in women who are very thin. What are the signs or symptoms? Symptoms of this condition include:  Hot flashes.  Irregular menstrual periods.  Night sweats.  Changes in feelings about sex. This could be a decrease in sex drive or an increased comfort around your sexuality.  Vaginal dryness and thinning of the vaginal walls. This may cause painful intercourse.  Dryness of the skin and development of wrinkles.  Headaches.  Problems sleeping (insomnia).  Mood swings or irritability.  Memory problems.  Weight gain.  Hair growth on the face and chest.  Bladder infections or problems with  urinating. How is this diagnosed? This condition is diagnosed based on your medical history, a physical exam, your age, your menstrual history, and your symptoms. Hormone tests may also be done. How is this treated? In some cases, no treatment is needed. You and your health care provider should make a decision together about whether treatment is necessary. Treatment will be based on your individual condition and preferences. Treatment for this condition focuses on managing symptoms. Treatment may include:  Menopausal hormone therapy (MHT).  Medicines to treat specific symptoms or complications.  Acupuncture.  Vitamin or herbal supplements. Before starting treatment, make sure to let your health care provider know if you have a personal or family history of:  Heart disease.  Breast cancer.  Blood clots.  Diabetes.  Osteoporosis. Follow these instructions at home: Lifestyle  Do not use any products that contain nicotine or tobacco, such as cigarettes and e-cigarettes. If you need help quitting, ask your health care provider.  Get at least 30 minutes of physical activity on 5 or more days each week.  Avoid alcoholic and caffeinated beverages, as well as spicy foods. This may help prevent hot flashes.  Get 7-8 hours of sleep each night.  If you have hot flashes, try: ? Dressing in layers. ? Avoiding things that may trigger hot flashes, such as spicy food, warm places, or stress. ? Taking slow, deep breaths when a hot flash starts. ? Keeping a fan in your home and office.  Find ways to manage stress, such as deep breathing, meditation, or journaling.  Consider going to group therapy with other women who are having menopause symptoms. Ask your health care provider about recommended group therapy meetings. Eating and  drinking  Eat a healthy, balanced diet that contains whole grains, lean protein, low-fat dairy, and plenty of fruits and vegetables.  Your health care provider  may recommend adding more soy to your diet. Foods that contain soy include tofu, tempeh, and soy milk.  Eat plenty of foods that contain calcium and vitamin D for bone health. Items that are rich in calcium include low-fat milk, yogurt, beans, almonds, sardines, broccoli, and kale. Medicines  Take over-the-counter and prescription medicines only as told by your health care provider.  Talk with your health care provider before starting any herbal supplements. If prescribed, take vitamins and supplements as told by your health care provider. These may include: ? Calcium. Women age 15 and older should get 1,200 mg (milligrams) of calcium every day. ? Vitamin D. Women need 600-800 International Units of vitamin D each day. ? Vitamins B12 and B6. Aim for 50 micrograms of B12 and 1.5 mg of B6 each day. General instructions  Keep track of your menstrual periods, including: ? When they occur. ? How heavy they are and how long they last. ? How much time passes between periods.  Keep track of your symptoms, noting when they start, how often you have them, and how long they last.  Use vaginal lubricants or moisturizers to help with vaginal dryness and improve comfort during sex.  Keep all follow-up visits as told by your health care provider. This is important. This includes any group therapy or counseling. Contact a health care provider if:  You are still having menstrual periods after age 28.  You have pain during sex.  You have not had a period for 12 months and you develop vaginal bleeding. Get help right away if:  You have: ? Severe depression. ? Excessive vaginal bleeding. ? Pain when you urinate. ? A fast or irregular heart beat (palpitations). ? Severe headaches. ? Abdomen (abdominal) pain or severe indigestion.  You fell and you think you have a broken bone.  You develop leg or chest pain.  You develop vision problems.  You feel a lump in your breast. Summary  Menopause  is the normal time of life when menstrual periods stop completely. It is usually confirmed by 12 months without a menstrual period.  The transition to menopause (perimenopause) most often happens between the ages of 74 and 18.  Symptoms can be managed through medicines, lifestyle changes, and complementary therapies such as acupuncture.  Eat a balanced diet that is rich in nutrients to promote bone health and heart health and to manage symptoms during menopause. This information is not intended to replace advice given to you by your health care provider. Make sure you discuss any questions you have with your health care provider. Document Revised: 03/20/2017 Document Reviewed: 05/10/2016 Elsevier Patient Education  2020 [employer]. Urinary Incontinence  Urinary incontinence refers to a condition in which a person is unable to control where and when to pass urine. A person with this condition will urinate when he or she does not mean to (involuntarily). What are the causes? This condition may be caused by:  Medicines.  Infections.  Constipation.  Overactive bladder muscles.  Weak bladder muscles.  Weak pelvic floor muscles. These muscles provide support for the bladder, intestine, and, in women, the uterus.  Enlarged prostate in men. The prostate is a gland near the bladder. When it gets too big, it can pinch the urethra. With the urethra blocked, the bladder can weaken and lose the ability to empty  properly.  Surgery.  Emotional factors, such as anxiety, stress, or post-traumatic stress disorder (PTSD).  Pelvic organ prolapse. This happens in women when organs shift out of place and into the vagina. This shift can prevent the bladder and urethra from working properly. What increases the risk? The following factors may make you more likely to develop this condition:  Older age.  Obesity and physical inactivity.  Pregnancy and childbirth.  Menopause.  Diseases that  affect the nerves or spinal cord (neurological diseases).  Long-term (chronic) coughing. This can increase pressure on the bladder and pelvic floor muscles. What are the signs or symptoms? Symptoms may vary depending on the type of urinary incontinence you have. They include:  A sudden urge to urinate, but passing urine involuntarily before you can get to a bathroom (urge incontinence).  Suddenly passing urine with any activity that forces urine to pass, such as coughing, laughing, exercise, or sneezing (stress incontinence).  Needing to urinate often, but urinating only a small amount, or constantly dribbling urine (overflow incontinence).  Urinating because you cannot get to the bathroom in time due to a physical disability, such as arthritis or injury, or communication and thinking problems, such as Alzheimer disease (functional incontinence). How is this diagnosed? This condition may be diagnosed based on:  Your medical history.  A physical exam.  Tests, such as: ? Urine tests. ? X-rays of your kidney and bladder. ? Ultrasound. ? CT scan. ? Cystoscopy. In this procedure, a health care provider inserts a tube with a light and camera (cystoscope) through the urethra and into the bladder in order to check for problems. ? Urodynamic testing. These tests assess how well the bladder, urethra, and sphincter can store and release urine. There are different types of urodynamic tests, and they vary depending on what the test is measuring. To help diagnose your condition, your health care provider may recommend that you keep a log of when you urinate and how much you urinate. How is this treated? Treatment for this condition depends on the type of incontinence that you have and its cause. Treatment may include:  Lifestyle changes, such as: ? Quitting smoking. ? Maintaining a healthy weight. ? Staying active. Try to get 150 minutes of moderate-intensity exercise every week. Ask your health  care provider which activities are safe for you. ? Eating a healthy diet.  Avoid high-fat foods, like fried foods.  Avoid refined carbohydrates like white bread and white rice.  Limit how much alcohol and caffeine you drink.  Increase your fiber intake. Foods such as fresh fruits, vegetables, beans, and whole grains are healthy sources of fiber.  Pelvic floor muscle exercises.  Bladder training, such as lengthening the amount of time between bathroom breaks, or using the bathroom at regular intervals.  Using techniques to suppress bladder urges. This can include distraction techniques or controlled breathing exercises.  Medicines to relax the bladder muscles and prevent bladder spasms.  Medicines to help slow or prevent the growth of a man's prostate.  Botox injections. These can help relax the bladder muscles.  Using pulses of electricity to help change bladder reflexes (electrical nerve stimulation).  For women, using a medical device to prevent urine leaks. This is a small, tampon-like, disposable device that is inserted into the urethra.  Injecting collagen or carbon beads (bulking agents) into the urinary sphincter. These can help thicken tissue and close the bladder opening.  Surgery. Follow these instructions at home: Lifestyle  Limit alcohol and caffeine. These can  fill your bladder quickly and irritate it.  Keep yourself clean to help prevent odors and skin damage. Ask your doctor about special skin creams and cleansers that can protect the skin from urine.  Consider wearing pads or adult diapers. Make sure to change them regularly, and always change them right after experiencing incontinence. General instructions  Take over-the-counter and prescription medicines only as told by your health care provider.  Use the bathroom about every 3-4 hours, even if you do not feel the need to urinate. Try to empty your bladder completely every time. After urinating, wait a  minute. Then try to urinate again.  Make sure you are in a relaxed position while urinating.  If your incontinence is caused by nerve problems, keep a log of the medicines you take and the times you go to the bathroom.  Keep all follow-up visits as told by your health care provider. This is important. Contact a health care provider if:  You have pain that gets worse.  Your incontinence gets worse. Get help right away if:  You have a fever or chills.  You are unable to urinate.  You have redness in your groin area or down your legs. Summary  Urinary incontinence refers to a condition in which a person is unable to control where and when to pass urine.  This condition may be caused by medicines, infection, weak bladder muscles, weak pelvic floor muscles, enlargement of the prostate (in men), or surgery.  The following factors increase your risk for developing this condition: older age, obesity, pregnancy and childbirth, menopause, neurological diseases, and chronic coughing.  There are several types of urinary incontinence. They include urge incontinence, stress incontinence, overflow incontinence, and functional incontinence.  This condition is usually treated first with lifestyle and behavioral changes, such as quitting smoking, eating a healthier diet, and doing regular pelvic floor exercises. Other treatment options include medicines, bulking agents, medical devices, electrical nerve stimulation, or surgery. This information is not intended to replace advice given to you by your health care provider. Make sure you discuss any questions you have with your health care provider. Document Revised: 04/17/2017 Document Reviewed: 07/17/2016 Elsevier Patient Education  2020 [employer].   Hyperthyroidism  Hyperthyroidism is when the thyroid gland is too active (overactive). The thyroid gland is a small gland located in the lower front part of the neck, just in front of the windpipe  (trachea). This gland makes hormones that help control how the body uses food for energy (metabolism) as well as how the heart and brain function. These hormones also play a role in keeping your bones strong. When the thyroid is overactive, it produces too much of a hormone called thyroxine. What are the causes? This condition may be caused by:  Graves' disease. This is a disorder in which the body's disease-fighting system (immune system) attacks the thyroid gland. This is the most common cause.  Inflammation of the thyroid gland.  A tumor in the thyroid gland.  Use of certain medicines, including: ? Prescription thyroid hormone replacement. ? Herbal supplements that mimic thyroid hormones. ? Amiodarone therapy.  Solid or fluid-filled lumps within your thyroid gland (thyroid nodules).  Taking in a large amount of iodine from foods or medicines. What increases the risk? You are more likely to develop this condition if:  You are female.  You have a family history of thyroid conditions.  You smoke tobacco.  You use a medicine called lithium.  You take medicines that affect the  immune system (immunosuppressants). What are the signs or symptoms? Symptoms of this condition include:  Nervousness.  Inability to tolerate heat.  Unexplained weight loss.  Diarrhea.  Change in the texture of hair or skin.  Heart skipping beats or making extra beats.  Rapid heart rate.  Loss of menstruation.  Shaky hands.  Fatigue.  Restlessness.  Sleep problems.  Enlarged thyroid gland or a lump in the thyroid (nodule). You may also have symptoms of Graves' disease, which may include:  Protruding eyes.  Dry eyes.  Red or swollen eyes.  Problems with vision. How is this diagnosed? This condition may be diagnosed based on:  Your symptoms and medical history.  A physical exam.  Blood tests.  Thyroid ultrasound. This test involves using sound waves to produce images of the  thyroid gland.  A thyroid scan. A radioactive substance is injected into a vein, and images show how much iodine is present in the thyroid.  Radioactive iodine uptake test (RAIU). A small amount of radioactive iodine is given by mouth to see how much iodine the thyroid absorbs after a certain amount of time. How is this treated? Treatment depends on the cause and severity of the condition. Treatment may include:  Medicines to reduce the amount of thyroid hormone your body makes.  Radioactive iodine treatment (radioiodine therapy). This involves swallowing a small dose of radioactive iodine, in capsule or liquid form, to kill thyroid cells.  Surgery to remove part or all of your thyroid gland. You may need to take thyroid hormone replacement medicine for the rest of your life after thyroid surgery.  Medicines to help manage your symptoms. Follow these instructions at home:   Take over-the-counter and prescription medicines only as told by your health care provider.  Do not use any products that contain nicotine or tobacco, such as cigarettes and e-cigarettes. If you need help quitting, ask your health care provider.  Follow any instructions from your health care provider about diet. You may be instructed to limit foods that contain iodine.  Keep all follow-up visits as told by your health care provider. This is important. ? You will need to have blood tests regularly so that your health care provider can monitor your condition. Contact a health care provider if:  Your symptoms do not get better with treatment.  You have a fever.  You are taking thyroid hormone replacement medicine and you: ? Have symptoms of depression. ? Feel like you are tired all the time. ? Gain weight. Get help right away if:  You have chest pain.  You have decreased alertness or a change in your awareness.  You have abdominal pain.  You feel dizzy.  You have a rapid heartbeat.  You have an  irregular heartbeat.  You have difficulty breathing. Summary  The thyroid gland is a small gland located in the lower front part of the neck, just in front of the windpipe (trachea).  Hyperthyroidism is when the thyroid gland is too active (overactive) and produces too much of a hormone called thyroxine.  The most common cause is Graves' disease, a disorder in which your immune system attacks the thyroid gland.  Hyperthyroidism can cause various symptoms, such as unexplained weight loss, nervousness, inability to tolerate heat, or changes in your heartbeat.  Treatment may include medicine to reduce the amount of thyroid hormone your body makes, radioiodine therapy, surgery, or medicines to manage symptoms. This information is not intended to replace advice given to you by your health  care provider. Make sure you discuss any questions you have with your health care provider. Document Revised: 03/20/2017 Document Reviewed: 03/18/2017 Elsevier Patient Education  2020 [employer].

## 2020-02-16 NOTE — Addendum Note (Signed)
Addended by: Guy Toney, Huntley Dec E on: 02/16/2020 04:17 PM   Modules accepted: Orders

## 2020-02-16 NOTE — Assessment & Plan Note (Signed)
BMI of 40.74 today.  It is possible the body habitus could be contributing to the urinary incontinence symptoms that the patient is experiencing.   Discussed with patient pelvic floor exercises that may help with her current symptoms however weight loss may be helpful for this condition as well.

## 2020-02-16 NOTE — Assessment & Plan Note (Addendum)
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  At this time it is unclear if this is due to changes in the pelvic floor muscle and body habitus or possibly of hormonal etiology given her recent amenorrhea. Recommend pelvic floor exercises and double voiding for the next 4 to 6 weeks to see if this helps with improvement of her symptoms. Patient provided with a long to note when she is experiencing symptoms of incontinence. We will check labs today to monitor for thyroid dysfunction and diabetes. Urinalysis negative today for pregnancy or infection. We will plan follow-up based on laboratory results.

## 2020-02-16 NOTE — Assessment & Plan Note (Signed)
Amenorrhea and 44 year old female with a history of tubal ligation. Urine pregnancy test negative today. Given the other symptoms that are present it is possible that she is going through Mary-Ann Pennella menopause however, at this time we cannot rule out other etiologies. We will plan to get labs today to monitor prolactin, FSH, and TSH.  We will also plan for pelvic ultrasound. Discussed with the patient that the symptoms of incontinence along with amenorrhea could indicate possible menopause.  If it is found that she is in menopause we can consider a very low-dose small amount of topical estrogen to see if this helps with her urinary symptoms. We will make changes to plan of care and determine follow-up based on laboratory and imaging results.

## 2020-02-17 LAB — FOLLICLE STIMULATING HORMONE: FSH: 10.3 m[IU]/mL

## 2020-02-17 LAB — CBC WITH DIFFERENTIAL/PLATELET
Absolute Monocytes: 687 cells/uL (ref 200–950)
Basophils Absolute: 32 cells/uL (ref 0–200)
Basophils Relative: 0.5 %
Eosinophils Absolute: 63 cells/uL (ref 15–500)
Eosinophils Relative: 1 %
HCT: 39.3 % (ref 35.0–45.0)
Hemoglobin: 12.5 g/dL (ref 11.7–15.5)
Lymphs Abs: 1512 cells/uL (ref 850–3900)
MCH: 25.8 pg — ABNORMAL LOW (ref 27.0–33.0)
MCHC: 31.8 g/dL — ABNORMAL LOW (ref 32.0–36.0)
MCV: 81.2 fL (ref 80.0–100.0)
MPV: 10.1 fL (ref 7.5–12.5)
Monocytes Relative: 10.9 %
Neutro Abs: 4007 cells/uL (ref 1500–7800)
Neutrophils Relative %: 63.6 %
Platelets: 260 10*3/uL (ref 140–400)
RBC: 4.84 10*6/uL (ref 3.80–5.10)
RDW: 13 % (ref 11.0–15.0)
Total Lymphocyte: 24 %
WBC: 6.3 10*3/uL (ref 3.8–10.8)

## 2020-02-17 LAB — COMPLETE METABOLIC PANEL WITH GFR
AG Ratio: 1.4 (calc) (ref 1.0–2.5)
ALT: 16 U/L (ref 6–29)
AST: 15 U/L (ref 10–30)
Albumin: 4 g/dL (ref 3.6–5.1)
Alkaline phosphatase (APISO): 85 U/L (ref 31–125)
BUN: 19 mg/dL (ref 7–25)
CO2: 27 mmol/L (ref 20–32)
Calcium: 9.6 mg/dL (ref 8.6–10.2)
Chloride: 102 mmol/L (ref 98–110)
Creat: 0.74 mg/dL (ref 0.50–1.10)
GFR, Est African American: 115 mL/min/{1.73_m2} (ref >=60)
GFR, Est Non African American: 99 mL/min/{1.73_m2} (ref >=60)
Globulin: 2.8 g/dL (calc) (ref 1.9–3.7)
Glucose, Bld: 89 mg/dL (ref 65–139)
Potassium: 4.5 mmol/L (ref 3.5–5.3)
Sodium: 136 mmol/L (ref 135–146)
Total Bilirubin: 0.3 mg/dL (ref 0.2–1.2)
Total Protein: 6.8 g/dL (ref 6.1–8.1)

## 2020-02-17 LAB — LIPID PANEL
Cholesterol: 160 mg/dL (ref <200)
HDL: 46 mg/dL — ABNORMAL LOW (ref >=50)
LDL Cholesterol (Calc): 82 mg/dL (calc)
Non-HDL Cholesterol (Calc): 114 mg/dL (calc) (ref <130)
Total CHOL/HDL Ratio: 3.5 (calc) (ref <5.0)
Triglycerides: 231 mg/dL — ABNORMAL HIGH (ref <150)

## 2020-02-17 LAB — PROLACTIN: Prolactin: 14.5 ng/mL

## 2020-02-17 LAB — HEMOGLOBIN A1C
Hgb A1c MFr Bld: 5.7 % of total Hgb — ABNORMAL HIGH (ref <5.7)
Mean Plasma Glucose: 117 (calc)
eAG (mmol/L): 6.5 (calc)

## 2020-02-17 LAB — TSH: TSH: 1.82 mIU/L

## 2020-02-17 NOTE — Progress Notes (Signed)
Natalie Hopkins,  Your TSH levels look great- no signs of thyroid dysfunction causing your issues.  Your FSH has dropped and is not showing to be in post-menopausal state, which means that you are likely in your mid cycle at this time. You may be in the beginning stages of menopause, which cause hormonal fluctuations and missed periods intermittently. Your prolactin levels are consistent with not being pregnant, so that is reassuring.   Your CBC and CMP look great. Your good cholesterol is a little lower than I would like, but your bad cholesterol is excellent. I would like you to focus on low saturated fats and carbohydrates in your diet and lets try to get your triglycerides down.  Your hemoglobin A1c is 5.7%, which indicated pre-diabetes. The same diet above will help with this.   We are waiting on the ultrasound, I know that they had to schedule it due to error with the order. I will update you on what that says when we get the information back.  SaraBeth

## 2020-02-20 ENCOUNTER — Other Ambulatory Visit: Payer: No Typology Code available for payment source

## 2020-02-21 ENCOUNTER — Other Ambulatory Visit: Payer: Self-pay | Admitting: Nurse Practitioner

## 2020-02-21 ENCOUNTER — Encounter: Payer: Self-pay | Admitting: Nurse Practitioner

## 2020-02-21 DIAGNOSIS — F908 Attention-deficit hyperactivity disorder, other type: Secondary | ICD-10-CM

## 2020-02-21 MED ORDER — LISDEXAMFETAMINE DIMESYLATE 40 MG PO CAPS: 40.0000 mg | ORAL_CAPSULE | ORAL | 0 refills | Status: DC

## 2020-02-21 NOTE — Telephone Encounter (Signed)
Refill provided for 30 days of Vyvanse. Follow-up appointment for ADHD due.

## 2020-02-22 ENCOUNTER — Ambulatory Visit (INDEPENDENT_AMBULATORY_CARE_PROVIDER_SITE_OTHER): Payer: No Typology Code available for payment source

## 2020-02-22 ENCOUNTER — Other Ambulatory Visit: Payer: Self-pay

## 2020-02-22 DIAGNOSIS — N912 Amenorrhea, unspecified: Secondary | ICD-10-CM

## 2020-02-22 DIAGNOSIS — N3946 Mixed incontinence: Secondary | ICD-10-CM | POA: Diagnosis not present

## 2020-02-23 NOTE — Progress Notes (Signed)
[name],   Your ultrasound results are in and there were no abnormalities detected on the scan.  The lining of your uterus is about 11 mm thick, which indicates that you are still making hormones to build the lining. My suspicion is that you are in the beginning phases of menopause and may experience slower than normal building of the lining in the uterus that is shed during your period.   This can result in periods that are irregular for the next several years. I would monitor closely and let me know if you are having any symptoms of pain or discomfort.

## 2020-04-02 ENCOUNTER — Encounter: Payer: Self-pay | Admitting: Nurse Practitioner

## 2020-04-30 ENCOUNTER — Encounter: Payer: Self-pay | Admitting: Nurse Practitioner

## 2020-04-30 DIAGNOSIS — Z0189 Encounter for other specified special examinations: Secondary | ICD-10-CM

## 2020-05-02 NOTE — Telephone Encounter (Signed)
Can we call lab to see if they offer testing for hair drug screen and would Rohipnol be detected? Non urgent   11 min spnet by self and staff billed

## 2020-05-22 ENCOUNTER — Other Ambulatory Visit: Payer: Self-pay

## 2020-05-22 ENCOUNTER — Encounter: Payer: Self-pay | Admitting: Osteopathic Medicine

## 2020-05-22 ENCOUNTER — Ambulatory Visit (INDEPENDENT_AMBULATORY_CARE_PROVIDER_SITE_OTHER): Payer: No Typology Code available for payment source | Admitting: Osteopathic Medicine

## 2020-05-22 VITALS — BP 135/87 | HR 79 | Wt 227.0 lb

## 2020-05-22 DIAGNOSIS — R7301 Impaired fasting glucose: Secondary | ICD-10-CM | POA: Diagnosis not present

## 2020-05-22 DIAGNOSIS — F411 Generalized anxiety disorder: Secondary | ICD-10-CM

## 2020-05-22 DIAGNOSIS — F908 Attention-deficit hyperactivity disorder, other type: Secondary | ICD-10-CM | POA: Diagnosis not present

## 2020-05-22 DIAGNOSIS — F331 Major depressive disorder, recurrent, moderate: Secondary | ICD-10-CM

## 2020-05-22 DIAGNOSIS — J3089 Other allergic rhinitis: Secondary | ICD-10-CM

## 2020-05-22 MED ORDER — LISDEXAMFETAMINE DIMESYLATE 40 MG PO CAPS: 40.0000 mg | ORAL_CAPSULE | ORAL | 0 refills | Status: DC

## 2020-05-22 MED ORDER — BUPROPION HCL ER (XL) 150 MG PO TB24: ORAL_TABLET | ORAL | 3 refills | Status: DC

## 2020-05-22 MED ORDER — FEXOFENADINE HCL 180 MG PO TABS: 180.0000 mg | ORAL_TABLET | Freq: Every day | ORAL | 3 refills | Status: AC

## 2020-05-22 MED ORDER — IPRATROPIUM BROMIDE 0.06 % NA SOLN: 2.0000 | Freq: Four times a day (QID) | NASAL | 1 refills | Status: DC

## 2020-05-22 MED ORDER — FLUOXETINE HCL 10 MG PO CAPS: 10.0000 mg | ORAL_CAPSULE | Freq: Every day | ORAL | 3 refills | Status: DC

## 2020-05-22 MED ORDER — MONTELUKAST SODIUM 10 MG PO TABS: 10.0000 mg | ORAL_TABLET | Freq: Every day | ORAL | 3 refills | Status: DC

## 2020-05-22 NOTE — Progress Notes (Signed)
HPI: Natalie Hopkins is a 45 y.o. female who  has a past medical history of Anal fissure, Anemia, Anxiety, Arthritis, Back pain, Depression, Diverticulosis, Gallstones, GERD (gastroesophageal reflux disease), Hypertension, Migraine, Morbid obesity (HCC), Multiple gastric ulcers, Pancreatitis, Sleep apnea, and Sleep apnea.  she presents to [facility] today, 05/22/20,  for chief complaint of:  ADHD medication follow up  Pt reports running out of Vyvanse in December 2021 (about a month and a half ago). Since then she has had difficulty focusing to complete her work. While on the medication she was able to focus and complete her work more easily without any side effects. Denies: palpitations, chest pain, shortness of breath  Sinus pressure Pt reports a recent URI about a week and a half ago. Most symptoms have resolved at this point, but she is still experiencing lingering congestion, sinus pressure, and ear pressure.  Denies: fever, cough, shortness of breath  Mental Health Concerns for anxiety and depression. Referral was previously sent to mood treatment center in [facility]. However, pt has been there previously and did not like it there. Pt plans on finding psychiatrist in [place] on her own, and will let us know where she chooses or if she needs a referral for the place she chooses. Not currently taking wellbutrin due to medication running out. Still taking trazadone and prozac.   GAD 7 : Generalized Anxiety Score 05/22/2020 12/18/2017  Nervous, Anxious, on Edge 2 2  Control/stop worrying 2 1  Worry too much - different things 2 2  Trouble relaxing 3 2  Restless 1 1  Easily annoyed or irritable 3 3  Afraid - awful might happen 1 2  Total GAD 7 Score 14 13  Anxiety Difficulty Very difficult Somewhat difficult   Flowsheet Row Office Visit from 05/22/2020 in [facility] Office Visit from 12/18/2017 in [facility]  Thoughts that you would be better off dead, or of hurting yourself in some way Not at all Not at all  PHQ-9 Total Score 18 15        Past medical, surgical, social and family history reviewed:  Patient Active Problem List   Diagnosis Date Noted  . Mixed stress and urge urinary incontinence 02/16/2020  . Amenorrhea 02/16/2020  . Body mass index (BMI) of 40.1-44.9 in adult (HCC) 02/16/2020  . Morbid obesity (HCC)   . Attention deficit disorder 12/18/2017  . Bipolar disorder (HCC) 12/18/2017  . Depressive disorder 12/18/2017  . Impaired fasting glucose 12/18/2017  . Pancreatitis 12/18/2017  . Sleep apnea 12/18/2017  . Irritable bowel syndrome 07/01/2016  . Abdominal pain, epigastric 07/01/2016  . Gastroesophageal reflux disease 07/01/2016  . GAD (generalized anxiety disorder) 05/29/2016  . Attention deficit hyperactivity disorder (ADHD), inattentive type, 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  . Gastro-esophageal reflux disease without esophagitis 08/31/2014  . Hiatal hernia 08/31/2014  . Status post bariatric surgery 01/04/2014  . MDD (major depressive disorder), recurrent episode, moderate (HCC) 10/04/2013  . Migraines 10/04/2013  . Hypertension 01/06/2013    Past Surgical History:  Procedure Laterality Date  . 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 CHOLECYSTECTOMY  2000  . ECTOPIC PREGNANCY SURGERY     twice, with removal of 1 tube  . TUBAL LIGATION      Social History   Tobacco Use  . Smoking status: Never Smoker  . Smokeless tobacco: Never Used  Substance Use Topics  . Alcohol use: Yes  Comment: once a month    Family History  Problem Relation Age of Onset  . Asthma Father   . Bladder Cancer Mother   . Irregular heart beat Mother   . Breast cancer Maternal Grandmother   . Diabetes Paternal Grandfather   . Ovarian cancer Maternal Aunt      Current medication list and allergy/intolerance information reviewed:    Current  Outpatient Medications  Medication Sig Dispense Refill  . Dexlansoprazole 30 MG capsule Take 30 mg by mouth daily.    . fexofenadine (ALLEGRA ALLERGY) 180 MG tablet Take 1 tablet (180 mg total) by mouth daily. 90 tablet 3  . ipratropium (ATROVENT) 0.06 % nasal spray Place 2 sprays into both nostrils 4 (four) times daily. As needed for runny nose / postnasal drip 15 mL 1  . traZODone (DESYREL) 100 MG tablet Take 100 mg by mouth at bedtime.    . vitamin B-12 (CYANOCOBALAMIN) 100 MCG tablet Take 50 mcg by mouth daily.    Marland Kitchen buPROPion (WELLBUTRIN XL) 150 MG 24 hr tablet Take 1 tab (150mg ) by mouth daily for the first 7 days then increase to full dose of 2 tabs (300mg ) by mouth daily. 60 tablet 3  . FLUoxetine (PROZAC) 10 MG capsule Take 1 capsule (10 mg total) by mouth daily. 90 capsule 3  . [START ON 07/21/2020] lisdexamfetamine (VYVANSE) 40 MG capsule Take 1 capsule (40 mg total) by mouth every morning. Script 3/3 30 capsule 0  . lisdexamfetamine (VYVANSE) 40 MG capsule Take 1 capsule (40 mg total) by mouth every morning. 30 capsule 0  . [START ON 06/21/2020] lisdexamfetamine (VYVANSE) 40 MG capsule Take 1 capsule (40 mg total) by mouth every morning. Script 2/3 30 capsule 0  . montelukast (SINGULAIR) 10 MG tablet Take 1 tablet (10 mg total) by mouth at bedtime. 90 tablet 3   No current facility-administered medications for this visit.    No Known Allergies    Review of Systems:  Constitutional:  No  fever, no chills, + recent illness  HEENT: No  headache, + ear fullness, +  sinus pressure  Cardiac: No  chest pain, No  pressure, No palpitations, No  Orthopnea  Respiratory:  No  shortness of breath. No  Cough  Psychiatric: +  concerns with depression, +  concerns with anxiety, + mood problems  Exam:  BP 135/87 (BP Location: Left Arm, Patient Position: Sitting, Cuff Size: Normal)   Pulse 79   Wt 227 lb (103 kg)   SpO2 98%   BMI 40.21 kg/m   Constitutional: VS see above. General  Appearance: alert, well-developed, well-nourished, NAD  Eyes: Normal lids and conjunctive, non-icteric sclera  Ears, Nose, Mouth, Throat: Normal external inspection ears. TM normal bilaterally.   Respiratory: Normal respiratory effort. no wheeze, no rhonchi, no rales  Cardiovascular: S1/S2 normal, no murmur, no rub/gallop auscultated. RRR. No lower extremity edema.   Musculoskeletal: Gait normal.   Neurological: Normal balance/coordination. No tremor.   Skin: warm, dry, intact.   Psychiatric: Normal judgment/insight. Normal mood and affect.   No results found for this or any previous visit (from the past 72 hour(s)).  No results found.   ASSESSMENT/PLAN: The primary encounter diagnosis was GAD (generalized anxiety disorder). Diagnoses of ADHD, adult residual type, MDD (major depressive disorder), recurrent episode, moderate (HCC), Impaired fasting glucose, and Environmental and seasonal allergies were also pertinent to this visit.   ADHD  Refill vynvanse 40 mg  GAD and MDD  Pt planning to find psychiatrist  on her own, and will inform of [place] of where she chooses/if she requires a referral  Refill Wellbutrin, Prozac, and trazodone as needed until established with psychiatry   Return as needed until established with psychiatry for medication management   Sinus pressure - no evidence of sinus infection, ear infection, or pneumonia  Rx for atrovent   Refill allegra   Continue at home OTC allergy and sinus medications as needed  Return if symptoms worsen or do not improve - would consider antibiotics if no improvement   Health maintenance   CBC, CMP, lipid panel   A1C - Hx of elevated fasting blood glucose   Refill singular   Return in 3 months for annual exam and follow up on lab work   Orders Placed This Encounter  Procedures  . CBC  . COMPLETE METABOLIC PANEL WITH GFR  . Lipid panel  . Hemoglobin A1c    Meds ordered this encounter  Medications  .  DISCONTD: lisdexamfetamine (VYVANSE) 40 MG capsule    Sig: Take 1 capsule (40 mg total) by mouth every morning. Script 3/3    Dispense:  30 capsule    Refill:  0  . DISCONTD: lisdexamfetamine (VYVANSE) 40 MG capsule    Sig: Take 1 capsule (40 mg total) by mouth every morning.    Dispense:  30 capsule    Refill:  0  . DISCONTD: lisdexamfetamine (VYVANSE) 40 MG capsule    Sig: Take 1 capsule (40 mg total) by mouth every morning. Script 2/3    Dispense:  30 capsule    Refill:  0  . lisdexamfetamine (VYVANSE) 40 MG capsule    Sig: Take 1 capsule (40 mg total) by mouth every morning. Script 3/3    Dispense:  30 capsule    Refill:  0  . lisdexamfetamine (VYVANSE) 40 MG capsule    Sig: Take 1 capsule (40 mg total) by mouth every morning.    Dispense:  30 capsule    Refill:  0  . lisdexamfetamine (VYVANSE) 40 MG capsule    Sig: Take 1 capsule (40 mg total) by mouth every morning. Script 2/3    Dispense:  30 capsule    Refill:  0  . montelukast (SINGULAIR) 10 MG tablet    Sig: Take 1 tablet (10 mg total) by mouth at bedtime.    Dispense:  90 tablet    Refill:  3  . fexofenadine (ALLEGRA ALLERGY) 180 MG tablet    Sig: Take 1 tablet (180 mg total) by mouth daily.    Dispense:  90 tablet    Refill:  3  . buPROPion (WELLBUTRIN XL) 150 MG 24 hr tablet    Sig: Take 1 tab (150mg ) by mouth daily for the first 7 days then increase to full dose of 2 tabs (300mg ) by mouth daily.    Dispense:  60 tablet    Refill:  3  . FLUoxetine (PROZAC) 10 MG capsule    Sig: Take 1 capsule (10 mg total) by mouth daily.    Dispense:  90 capsule    Refill:  3  . ipratropium (ATROVENT) 0.06 % nasal spray    Sig: Place 2 sprays into both nostrils 4 (four) times daily. As needed for runny nose / postnasal drip    Dispense:  15 mL    Refill:  1    Patient Instructions  Plan:  Continue Vyvyanse, Wellbutein (150 --> 300 mg) and Prozac, use Trazodone as needed   Please follow  up with psychiatry, let me know  where you decide to go / if you need referral - have them send me records (can fax [phone]) especially if medications are changed!   Will be due for annual / follow up labs (last done 01/2020) at next visit   Let me know if nasal spray and OTC Rx as below aren't helping        Medications & Home Remedies for Upper Respiratory Illness   Note: the following list assumes no pregnancy, normal liver & kidney function and no other drug interactions. Dr. Lyn Hollingshead has highlighted medications which are safe for you to use, but these may not be appropriate for everyone. Always ask a pharmacist or qualified medical provider if you have any questions!    Aches/Pains, Fever, Headache OTC Acetaminophen (Tylenol) 500 mg tablets - take max 2 tablets (1000 mg) every 6 hours (4 times per day)  OTC Ibuprofen (Motrin) 200 mg tablets - take max 4 tablets (800 mg) every 6 hours*   Sinus Congestion Prescription Atrovent as directed OTC Nasal Saline if desired to rinse OTC Oxymetolazone (Afrin, others) sparing use due to rebound congestion, NEVER use in kids OTC Phenylephrine (Sudafed) 10 mg tablets every 4 hours (or the 12-hour formulation)* OTC Diphenhydramine (Benadryl) 25 mg tablets - take max 2 tablets every 4 hours   Cough & Sore Throat OTC Dextromethorphan (Robitussin, others) - cough suppressant OTC Guaifenesin (Robitussin, Mucinex, others) - expectorant (helps cough up mucus) (Dextromethorphan and Guaifenesin also come in a combination tablet/syrup) OTC Lozenges w/ Benzocaine + Menthol (Cepacol) Honey - as much as you want! Teas which "coat the throat" - look for ingredients Elm Bark, Licorice Root, Marshmallow Root   Other  OTC Zinc Lozenges within 24 hours of symptoms onset - mixed evidence this shortens the duration of the common cold Don't waste your money on Vitamin C or Echinacea in acute illness - it's already too late!    *Caution in patients with high blood pressure             Visit summary with medication list and pertinent instructions was printed for patient to review. All questions at time of visit were answered - patient instructed to contact office with any additional concerns or updates. ER/RTC precautions were reviewed with the patient.     Please note: voice recognition software was used to produce this document, and typos may escape review. Please contact Dr. Lyn Hollingshead for any needed clarifications.     Follow-up plan: Return in about 3 months (around 08/19/2020) for ANNUAL (labs 2+ days prior to appt, orders are in to follow up on prediabetic range sugars, etc).

## 2020-05-22 NOTE — Patient Instructions (Addendum)
Plan:  Continue Vyvyanse, Wellbutein (150 --> 300 mg) and Prozac, use Trazodone as needed   Please follow up with psychiatry, let me know where you decide to go / if you need referral - have them send me records (can fax [phone]) especially if medications are changed!   Will be due for annual / follow up labs (last done 01/2020) at next visit   Let me know if nasal spray and OTC Rx as below aren't helping        Medications & Home Remedies for Upper Respiratory Illness   Note: the following list assumes no pregnancy, normal liver & kidney function and no other drug interactions. Dr. Lyn Hollingshead has highlighted medications which are safe for you to use, but these may not be appropriate for everyone. Always ask a pharmacist or qualified medical provider if you have any questions!    Aches/Pains, Fever, Headache OTC Acetaminophen (Tylenol) 500 mg tablets - take max 2 tablets (1000 mg) every 6 hours (4 times per day)  OTC Ibuprofen (Motrin) 200 mg tablets - take max 4 tablets (800 mg) every 6 hours*   Sinus Congestion Prescription Atrovent as directed OTC Nasal Saline if desired to rinse OTC Oxymetolazone (Afrin, others) sparing use due to rebound congestion, NEVER use in kids OTC Phenylephrine (Sudafed) 10 mg tablets every 4 hours (or the 12-hour formulation)* OTC Diphenhydramine (Benadryl) 25 mg tablets - take max 2 tablets every 4 hours   Cough & Sore Throat OTC Dextromethorphan (Robitussin, others) - cough suppressant OTC Guaifenesin (Robitussin, Mucinex, others) - expectorant (helps cough up mucus) (Dextromethorphan and Guaifenesin also come in a combination tablet/syrup) OTC Lozenges w/ Benzocaine + Menthol (Cepacol) Honey - as much as you want! Teas which "coat the throat" - look for ingredients Elm Bark, Licorice Root, Marshmallow Root   Other  OTC Zinc Lozenges within 24 hours of symptoms onset - mixed evidence this shortens the duration of the common cold Don't waste  your money on Vitamin C or Echinacea in acute illness - it's already too late!    *Caution in patients with high blood pressure

## 2020-08-02 ENCOUNTER — Encounter: Payer: Self-pay | Admitting: Osteopathic Medicine

## 2020-08-02 DIAGNOSIS — F908 Attention-deficit hyperactivity disorder, other type: Secondary | ICD-10-CM

## 2020-08-02 MED ORDER — LISDEXAMFETAMINE DIMESYLATE 40 MG PO CAPS: 40.0000 mg | ORAL_CAPSULE | ORAL | 0 refills | Status: DC

## 2020-08-21 ENCOUNTER — Encounter: Payer: No Typology Code available for payment source | Admitting: Osteopathic Medicine

## 2020-08-28 LAB — COMPLETE METABOLIC PANEL WITH GFR
AG Ratio: 1.5 (calc) (ref 1.0–2.5)
ALT: 24 U/L (ref 6–29)
AST: 19 U/L (ref 10–30)
Albumin: 3.8 g/dL (ref 3.6–5.1)
Alkaline phosphatase (APISO): 81 U/L (ref 31–125)
BUN: 22 mg/dL (ref 7–25)
CO2: 27 mmol/L (ref 20–32)
Calcium: 8.8 mg/dL (ref 8.6–10.2)
Chloride: 105 mmol/L (ref 98–110)
Creat: 0.67 mg/dL (ref 0.50–1.10)
GFR, Est African American: 124 mL/min/{1.73_m2} (ref >=60)
GFR, Est Non African American: 107 mL/min/{1.73_m2} (ref >=60)
Globulin: 2.5 g/dL (calc) (ref 1.9–3.7)
Glucose, Bld: 109 mg/dL — ABNORMAL HIGH (ref 65–99)
Potassium: 4.3 mmol/L (ref 3.5–5.3)
Sodium: 137 mmol/L (ref 135–146)
Total Bilirubin: 0.3 mg/dL (ref 0.2–1.2)
Total Protein: 6.3 g/dL (ref 6.1–8.1)

## 2020-08-28 LAB — CBC
HCT: 37.4 % (ref 35.0–45.0)
Hemoglobin: 11.9 g/dL (ref 11.7–15.5)
MCH: 25.6 pg — ABNORMAL LOW (ref 27.0–33.0)
MCHC: 31.8 g/dL — ABNORMAL LOW (ref 32.0–36.0)
MCV: 80.6 fL (ref 80.0–100.0)
MPV: 9.5 fL (ref 7.5–12.5)
Platelets: 231 10*3/uL (ref 140–400)
RBC: 4.64 10*6/uL (ref 3.80–5.10)
RDW: 13.1 % (ref 11.0–15.0)
WBC: 5.9 10*3/uL (ref 3.8–10.8)

## 2020-08-28 LAB — HEMOGLOBIN A1C
Hgb A1c MFr Bld: 5.9 % of total Hgb — ABNORMAL HIGH (ref <5.7)
Mean Plasma Glucose: 123 mg/dL
eAG (mmol/L): 6.8 mmol/L

## 2020-08-28 LAB — LIPID PANEL
Cholesterol: 161 mg/dL (ref <200)
HDL: 43 mg/dL — ABNORMAL LOW (ref >=50)
LDL Cholesterol (Calc): 97 mg/dL (calc)
Non-HDL Cholesterol (Calc): 118 mg/dL (calc) (ref <130)
Total CHOL/HDL Ratio: 3.7 (calc) (ref <5.0)
Triglycerides: 114 mg/dL (ref <150)

## 2020-09-05 ENCOUNTER — Encounter: Payer: Self-pay | Admitting: Osteopathic Medicine

## 2020-09-05 DIAGNOSIS — F908 Attention-deficit hyperactivity disorder, other type: Secondary | ICD-10-CM

## 2020-09-06 MED ORDER — LISDEXAMFETAMINE DIMESYLATE 40 MG PO CAPS: 40.0000 mg | ORAL_CAPSULE | ORAL | 0 refills | Status: DC

## 2020-09-11 ENCOUNTER — Other Ambulatory Visit: Payer: Self-pay | Admitting: Osteopathic Medicine

## 2020-09-11 DIAGNOSIS — F411 Generalized anxiety disorder: Secondary | ICD-10-CM

## 2020-09-11 DIAGNOSIS — F908 Attention-deficit hyperactivity disorder, other type: Secondary | ICD-10-CM

## 2020-09-11 DIAGNOSIS — F319 Bipolar disorder, unspecified: Secondary | ICD-10-CM

## 2020-09-11 DIAGNOSIS — I10 Essential (primary) hypertension: Secondary | ICD-10-CM

## 2020-09-11 DIAGNOSIS — Z0000 Encounter for general adult medical examination without abnormal findings: Secondary | ICD-10-CM

## 2020-09-11 DIAGNOSIS — F331 Major depressive disorder, recurrent, moderate: Secondary | ICD-10-CM

## 2020-09-11 DIAGNOSIS — K219 Gastro-esophageal reflux disease without esophagitis: Secondary | ICD-10-CM

## 2020-09-11 DIAGNOSIS — R7301 Impaired fasting glucose: Secondary | ICD-10-CM

## 2020-09-13 ENCOUNTER — Telehealth (INDEPENDENT_AMBULATORY_CARE_PROVIDER_SITE_OTHER): Payer: No Typology Code available for payment source | Admitting: Osteopathic Medicine

## 2020-09-13 ENCOUNTER — Encounter: Payer: Self-pay | Admitting: Osteopathic Medicine

## 2020-09-13 DIAGNOSIS — Z1231 Encounter for screening mammogram for malignant neoplasm of breast: Secondary | ICD-10-CM | POA: Diagnosis not present

## 2020-09-13 DIAGNOSIS — M79641 Pain in right hand: Secondary | ICD-10-CM

## 2020-09-13 DIAGNOSIS — N939 Abnormal uterine and vaginal bleeding, unspecified: Secondary | ICD-10-CM

## 2020-09-13 DIAGNOSIS — Z0000 Encounter for general adult medical examination without abnormal findings: Secondary | ICD-10-CM | POA: Diagnosis not present

## 2020-09-13 DIAGNOSIS — M79642 Pain in left hand: Secondary | ICD-10-CM

## 2020-09-13 NOTE — Progress Notes (Signed)
Attempted to contact the patient at 230 pm. No answer, unable to leave a vm msg. Vm is full.

## 2020-09-13 NOTE — Patient Instructions (Signed)
General Preventive Care  Most recent routine screening labs: reviewed.   Blood pressure goal 130/80 or less.   Tobacco: don't!   Alcohol: responsible moderation is ok for most adults - if you have concerns about your alcohol intake, please talk to me!   Exercise: as tolerated to reduce risk of cardiovascular disease and diabetes. Strength training will also prevent osteoporosis.   Mental health: will work on getting set up with a new psychiatrist   Sexual / Reproductive health: if need for STD testing, or if concerns with libido/pain problems, please let me know!   Advanced Directive: Living Will and/or Healthcare Power of Attorney recommended for all adults, regardless of age or health.  Vaccines  Flu vaccine: for almost everyone, every fall.   Shingles vaccine: after age 38.   Pneumonia vaccines: after age 35.  Tetanus booster: every 10 years - due 2027  COVID vaccine: THANKS for getting your vaccine! :) BOOSTERS STRONGLY RECOMMENDED  Cancer screenings   Colon cancer screening: for everyone age 34-75.   Breast cancer screening: mammogram at age 66   Cervical cancer screening: Pap due 2025  Lung cancer screening: not needed  Infection screenings  . HIV: recommended screening at least once age 80-65, more often as needed. . Gonorrhea/Chlamydia: screening as needed . Hepatitis C: recommended once for everyone age 83-75 . TB: certain at-risk populations, or depending on work requirements and/or travel history Other . Bone Density Test: recommended for women at age 68

## 2020-09-13 NOTE — Progress Notes (Signed)
Telemedicie Visit via  Video & Audio (App used: MyChart) Audio only - telephone (patient preference /  technical difficulty with MyChart video application)  I connected with [name] on 09/13/20 at 2:48 PM  by phone or  telemedicine application as noted above  I verified that I am speaking with or regarding  the correct patient using two identifiers.  Participants: Myself, Dr Sunnie Nielsen DO Patient: Natalie Hopkins Patient proxy if applicable: none Other, if applicable: none  Patient is at home I am in office at [facility]    I discussed the limitations of evaluation and management  by telemedicine and the availability of in person appointments.  The participant(s) above expressed understanding and  agreed to proceed with this appointment via telemedicine.       History of Present Illness: Natalie Hopkins is a 45 y.o. female who would like to discuss  Annual physical  Hand pain - bad in AM and about same all day, L worse than R, "hurts" but not really stiff, no other joints involved  AUB: seen by one of our NP's 01/2020, w/u nonrevelaing for abnormal periods, now reports no menstruaation for 3_ mos and currenlty noting heavy bleeidng         Observations/Objective: There were no vitals taken for this visit. BP Readings from Last 3 Encounters:  05/22/20 135/87  02/16/20 136/84  08/18/19 132/87   Exam: Normal Speech.  NAD  Lab and Radiology Results No results found for this or any previous visit (from the past 72 hour(s)). No results found.     Assessment and Plan: 45 y.o. female with The primary encounter diagnosis was Encounter for preventive care. Diagnoses of Breast cancer screening by mammogram, Abnormal uterine bleeding, and Bilateral hand pain were also pertinent to this visit.  1. Encounter for preventive care See below  2. Breast cancer screening by mammogram ordered  3. Abnormal uterine bleeding Offered office visit for  physical exam and w/u vs refer to OBGYN since we've already looked at this and they may want to discuss ablation/hyst vs conservative OCP, Mirena, other. Pt opts for referral   4. Bilateral hand pain Offered in person visit w/ me or Dr T pt will call to scheule    PDMP not reviewed this encounter. Orders Placed This Encounter  Procedures  . MM 3D SCREEN BREAST BILATERAL    Order Specific Question:   Reason for Exam (SYMPTOM  OR DIAGNOSIS REQUIRED)    Answer:   SCREEN    Order Specific Question:   Is the patient pregnant?    Answer:   No    Order Specific Question:   Preferred imaging location?    Answer:   [facility]  . Ambulatory referral to Obstetrics / Gynecology    Referral Priority:   Routine    Referral Type:   Consultation    Referral Reason:   Specialty Services Required    Requested Specialty:   Obstetrics and Gynecology    Number of Visits Requested:   1   No orders of the defined types were placed in this encounter.  Patient Instructions  General Preventive Care  Most recent routine screening labs: reviewed.   Blood pressure goal 130/80 or less.   Tobacco: don't!   Alcohol: responsible moderation is ok for most adults - if you have concerns about your alcohol intake, please talk to me!   Exercise: as tolerated to reduce risk of cardiovascular disease and diabetes. Strength training will also prevent osteoporosis.  Mental health: will work on getting set up with a new psychiatrist   Sexual / Reproductive health: if need for STD testing, or if concerns with libido/pain problems, please let me know!   Advanced Directive: Living Will and/or Healthcare Power of Attorney recommended for all adults, regardless of age or health.  Vaccines  Flu vaccine: for almost everyone, every fall.   Shingles vaccine: after age 4.   Pneumonia vaccines: after age 73.  Tetanus booster: every 10 years - due 2027  COVID vaccine: THANKS for getting your vaccine! :)  BOOSTERS STRONGLY RECOMMENDED  Cancer screenings   Colon cancer screening: for everyone age 38-75.   Breast cancer screening: mammogram at age 54   Cervical cancer screening: Pap due 2025  Lung cancer screening: not needed  Infection screenings  . HIV: recommended screening at least once age 33-65, more often as needed. . Gonorrhea/Chlamydia: screening as needed . Hepatitis C: recommended once for everyone age 46-75 . TB: certain at-risk populations, or depending on work requirements and/or travel history Other . Bone Density Test: recommended for women at age 69        Instructions sent via MyChart.   Follow Up Instructions: Return in about 1 year (around 09/13/2021) for [place] (call week prior to visit for lab orders).    I discussed the assessment and treatment plan with the patient. The patient was provided an opportunity to ask questions and all were answered. The patient agreed with the plan and demonstrated an understanding of the instructions.   The patient was advised to call back or seek an in-person evaluation if any new concerns, if symptoms worsen or if the condition fails to improve as anticipated.  30 minutes of non-face-to-face time was provided during this encounter.      . . . . . . . . . . . . . Marland Kitchen                   Historical information moved to improve visibility of documentation.  Past Medical History:  Diagnosis Date  . Anal fissure   . Anemia   . Anxiety   . Arthritis   . Back pain   . Depression   . Diverticulosis    on CT  . Gallstones   . GERD (gastroesophageal reflux disease)   . Hypertension   . Migraine   . Morbid obesity (HCC)   . Multiple gastric ulcers   . Pancreatitis   . Sleep apnea   . Sleep apnea    Past Surgical History:  Procedure Laterality Date  . 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 CHOLECYSTECTOMY  2000  . ECTOPIC PREGNANCY SURGERY     twice, with removal of 1 tube   . TUBAL LIGATION     Social History   Tobacco Use  . Smoking status: Never Smoker  . Smokeless tobacco: Never Used  Substance Use Topics  . Alcohol use: Yes    Comment: once a month   family history includes Asthma in her father; Bladder Cancer in her mother; Breast cancer in her maternal grandmother; Diabetes in her paternal grandfather; Irregular heart beat in her mother; Ovarian cancer in her maternal aunt.  Medications: Current Outpatient Medications  Medication Sig Dispense Refill  . buPROPion (WELLBUTRIN XL) 150 MG 24 hr tablet Take 1 tab (150mg ) by mouth daily for the first 7 days then increase to full dose of 2 tabs (300mg ) by mouth daily. 60 tablet 3  .  Dexlansoprazole 30 MG capsule Take 30 mg by mouth daily.    . fexofenadine (ALLEGRA ALLERGY) 180 MG tablet Take 1 tablet (180 mg total) by mouth daily. 90 tablet 3  . FLUoxetine (PROZAC) 10 MG capsule Take 1 capsule (10 mg total) by mouth daily. 90 capsule 3  . ipratropium (ATROVENT) 0.06 % nasal spray Place 2 sprays into both nostrils 4 (four) times daily. As needed for runny nose / postnasal drip 15 mL 1  . lisdexamfetamine (VYVANSE) 40 MG capsule Take 1 capsule (40 mg total) by mouth every morning. 30 capsule 0  . montelukast (SINGULAIR) 10 MG tablet Take 1 tablet (10 mg total) by mouth at bedtime. 90 tablet 3  . traZODone (DESYREL) 100 MG tablet Take 100 mg by mouth at bedtime.    . vitamin B-12 (CYANOCOBALAMIN) 100 MCG tablet Take 50 mcg by mouth daily.    Marland Kitchen lisdexamfetamine (VYVANSE) 40 MG capsule Take 1 capsule (40 mg total) by mouth every morning. 30 capsule 0  . lisdexamfetamine (VYVANSE) 40 MG capsule Take 1 capsule (40 mg total) by mouth every morning. Script 2/3 30 capsule 0   No current facility-administered medications for this visit.   No Known Allergies  Recent Results (from the past 2160 hour(s))  CBC     Status: Abnormal   Collection Time: 08/27/20  7:23 AM  Result Value Ref Range   WBC 5.9 3.8 - 10.8  Thousand/uL   RBC 4.64 3.80 - 5.10 Million/uL   Hemoglobin 11.9 11.7 - 15.5 g/dL   HCT 71.2 45.8 - 09.9 %   MCV 80.6 80.0 - 100.0 fL   MCH 25.6 (L) 27.0 - 33.0 pg   MCHC 31.8 (L) 32.0 - 36.0 g/dL   RDW 83.3 82.5 - 05.3 %   Platelets 231 140 - 400 Thousand/uL   MPV 9.5 7.5 - 12.5 fL  COMPLETE METABOLIC PANEL WITH GFR     Status: Abnormal   Collection Time: 08/27/20  7:23 AM  Result Value Ref Range   Glucose, Bld 109 (H) 65 - 99 mg/dL    Comment: .            Fasting reference interval . For someone without known diabetes, a glucose value between 100 and 125 mg/dL is consistent with prediabetes and should be confirmed with a follow-up test. .    BUN 22 7 - 25 mg/dL   Creat 9.76 7.34 - 1.93 mg/dL   GFR, Est Non African American 107 > OR = 60 mL/min/1.55m2   GFR, Est African American 124 > OR = 60 mL/min/1.33m2   BUN/Creatinine Ratio NOT APPLICABLE 6 - 22 (calc)   Sodium 137 135 - 146 mmol/L   Potassium 4.3 3.5 - 5.3 mmol/L   Chloride 105 98 - 110 mmol/L   CO2 27 20 - 32 mmol/L   Calcium 8.8 8.6 - 10.2 mg/dL   Total Protein 6.3 6.1 - 8.1 g/dL   Albumin 3.8 3.6 - 5.1 g/dL   Globulin 2.5 1.9 - 3.7 g/dL (calc)   AG Ratio 1.5 1.0 - 2.5 (calc)   Total Bilirubin 0.3 0.2 - 1.2 mg/dL   Alkaline phosphatase (APISO) 81 31 - 125 U/L   AST 19 10 - 30 U/L   ALT 24 6 - 29 U/L  Lipid panel     Status: Abnormal   Collection Time: 08/27/20  7:23 AM  Result Value Ref Range   Cholesterol 161 <200 mg/dL   HDL 43 (L) > OR = 50  mg/dL   Triglycerides 161114 <096<150 mg/dL   LDL Cholesterol (Calc) 97 mg/dL (calc)    Comment: Reference range: <100 . Desirable range <100 mg/dL for primary prevention;   <70 mg/dL for patients with CHD or diabetic patients  with > or = 2 CHD risk factors. Marland Kitchen. LDL-C is now calculated using the Martin-Hopkins  calculation, which is a validated novel method providing  better accuracy than the Friedewald equation in the  estimation of LDL-C.  Horald PollenMartin SS et al. Lenox AhrJAMA.  0454;098(112013;310(19): 2061-2068  (http://education.[facility].com/faq/FAQ164)    Total CHOL/HDL Ratio 3.7 <5.0 (calc)   Non-HDL Cholesterol (Calc) 118 <130 mg/dL (calc)    Comment: For patients with diabetes plus 1 major ASCVD risk  factor, treating to a non-HDL-C goal of <100 mg/dL  (LDL-C of <91<70 mg/dL) is considered a therapeutic  option.   Hemoglobin A1c     Status: Abnormal   Collection Time: 08/27/20  7:23 AM  Result Value Ref Range   Hgb A1c MFr Bld 5.9 (H) <5.7 % of total Hgb    Comment: For someone without known diabetes, a hemoglobin  A1c value between 5.7% and 6.4% is consistent with prediabetes and should be confirmed with a  follow-up test. . For someone with known diabetes, a value <7% indicates that their diabetes is well controlled. A1c targets should be individualized based on duration of diabetes, age, comorbid conditions, and other considerations. . This assay result is consistent with an increased risk of diabetes. . Currently, no consensus exists regarding use of hemoglobin A1c for diagnosis of diabetes for children. .    Mean Plasma Glucose 123 mg/dL   eAG (mmol/L) 6.8 mmol/L     If phone visit, billing and coding can please add appropriate modifier if needed

## 2020-09-26 ENCOUNTER — Other Ambulatory Visit: Payer: Self-pay

## 2020-09-26 ENCOUNTER — Ambulatory Visit (INDEPENDENT_AMBULATORY_CARE_PROVIDER_SITE_OTHER): Payer: No Typology Code available for payment source | Admitting: Obstetrics & Gynecology

## 2020-09-26 ENCOUNTER — Encounter: Payer: Self-pay | Admitting: Obstetrics & Gynecology

## 2020-09-26 VITALS — BP 139/79 | HR 74 | Resp 16 | Ht 63.0 in | Wt 228.0 lb

## 2020-09-26 DIAGNOSIS — N926 Irregular menstruation, unspecified: Secondary | ICD-10-CM | POA: Diagnosis not present

## 2020-09-26 DIAGNOSIS — M2540 Effusion, unspecified joint: Secondary | ICD-10-CM

## 2020-09-26 DIAGNOSIS — N3946 Mixed incontinence: Secondary | ICD-10-CM

## 2020-09-26 MED ORDER — NORETHINDRONE 0.35 MG PO TABS
1.0000 | ORAL_TABLET | Freq: Every day | ORAL | 11 refills | Status: DC
Start: 2020-09-26 — End: 2021-01-21

## 2020-09-26 MED ORDER — MIRABEGRON ER 25 MG PO TB24: 25.0000 mg | ORAL_TABLET | Freq: Every day | ORAL | 12 refills | Status: DC

## 2020-09-26 MED ORDER — OXYBUTYNIN CHLORIDE ER 5 MG PO TB24: 5.0000 mg | ORAL_TABLET | Freq: Every day | ORAL | 2 refills | Status: DC

## 2020-09-26 NOTE — Progress Notes (Signed)
45 y.o. G82P3020 Married White or Caucasian female here for several concerns.  She is sent in consultation from Dr. Sunnie Nielsen who is her PCP.  Pt reports, first, that she's been having irregular cycles over the past year.  She has skipped up to 3 months at a time with cycles.  When this occurs, her cycles are much heavier with more cramping and clotting.  She just had a cycle, 09/12/2020, after skipping for 3 months.  Pt did have an ultrasound 02/22/2020.  Report reviewed.  Images reviewed personally as well.  Uterus normal without masses.  Endometrium 39mm without focal abnormality.  She has a TSH, FSH and prolactin obtained as well.  FSH was 10 but was 20 in 2021 as well.  Reviewed with pt this indicates some ovarian insufficiency and that she is likely not ovulating regularly resulting in abnormal cycles.  Treatment options reviewed.  Pt would like to start conservatively.  Second concern is about incontinence.  She has noticed over the past year worsening symptoms with urine leakage.  Wears a pap all of the time.  Sometimes leakage is with coughing and sneezing or lifting, sometimes she has urgency symptoms.  Types of incontinence discussed as well as treatment options.  Lastly, she is having intermittent issues with pain and swelling of alternating joints in her hands in particular.  Sometimes is it distal and sometimes middle joints of her fingers.  Can be very painful to the touch at times.  Has also started experiencing a red rash across chest and arms at times.  Resolves spontaneously.  Does not have itching with this.  Not sure if related.  Patient's last menstrual period was 09/12/2020.           Health Maintenance: Pap:  11/01/2018 normal MMG:  Scheduled this month   reports that she has never smoked. She has never used smokeless tobacco. She reports current alcohol use. She reports that she does not use drugs.  Past Medical History:  Diagnosis Date  . Anal fissure   . Anemia   .  Anxiety   . Arthritis   . Back pain   . Depression   . Diverticulosis    on CT  . Gallstones   . GERD (gastroesophageal reflux disease)   . Hypertension   . Migraine   . Morbid obesity (HCC)   . Multiple gastric ulcers   . Pancreatitis   . Sleep apnea   . Sleep apnea     Past Surgical History:  Procedure Laterality Date  . 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 CHOLECYSTECTOMY  2000  . ECTOPIC PREGNANCY SURGERY     twice, with removal of 1 tube  . TUBAL LIGATION      Current Outpatient Medications  Medication Sig Dispense Refill  . buPROPion (WELLBUTRIN XL) 150 MG 24 hr tablet Take 1 tab (150mg ) by mouth daily for the first 7 days then increase to full dose of 2 tabs (300mg ) by mouth daily. 60 tablet 3  . buPROPion (WELLBUTRIN XL) 300 MG 24 hr tablet bupropion HCl XL 300 mg 24 hr tablet, extended release    . Dexlansoprazole 30 MG capsule Take 30 mg by mouth daily.    . ergocalciferol (VITAMIN D2) 1.25 MG (50000 UT) capsule ergocalciferol (vitamin D2) 1,250 mcg (50,000 unit) capsule  TK 1 C PO Q WK    . fexofenadine (ALLEGRA ALLERGY) 180 MG tablet Take 1 tablet (180 mg total) by mouth daily. 90 tablet  3  . FLUoxetine (PROZAC) 10 MG capsule Take 1 capsule (10 mg total) by mouth daily. 90 capsule 3  . fluticasone (FLONASE) 50 MCG/ACT nasal spray fluticasone propionate 50 mcg/actuation nasal spray,suspension  SHAKE LQ AND U 1 SPR IEN HS    . ipratropium (ATROVENT) 0.06 % nasal spray Place 2 sprays into both nostrils 4 (four) times daily. As needed for runny nose / postnasal drip 15 mL 1  . lisdexamfetamine (VYVANSE) 40 MG capsule Take 1 capsule (40 mg total) by mouth every morning. 30 capsule 0  . montelukast (SINGULAIR) 10 MG tablet Take 1 tablet (10 mg total) by mouth at bedtime. 90 tablet 3  . traZODone (DESYREL) 100 MG tablet Take 100 mg by mouth at bedtime.    . vitamin B-12 (CYANOCOBALAMIN) 100 MCG tablet Take 50 mcg by mouth daily.     No current  facility-administered medications for this visit.    Family History  Problem Relation Age of Onset  . Asthma Father   . Bladder Cancer Mother   . Irregular heart beat Mother   . Breast cancer Maternal Grandmother   . Diabetes Paternal Grandfather   . Ovarian cancer Maternal Aunt     Review of Systems  Constitutional: Negative.   Respiratory: Negative.   Cardiovascular: Negative.   Gastrointestinal: Negative.   Genitourinary: Positive for menstrual problem.       Urine leaking  Hematological: Negative.   Psychiatric/Behavioral: Negative.     Exam:   BP 139/79   Pulse 74   Resp 16   Ht 5\' 3"  (1.6 m)   Wt 228 lb (103.4 kg)   LMP 09/12/2020   BMI 40.39 kg/m   Height: 5\' 3"  (160 cm)  Physical Exam Constitutional:      Appearance: Normal appearance.  Pulmonary:     Effort: Pulmonary effort is normal.  Skin:    General: Skin is warm and dry.  Neurological:     General: No focal deficit present.     Mental Status: She is alert.  Psychiatric:        Mood and Affect: Mood normal.     Assessment/Plan: 1. Mixed stress and urge urinary incontinence - types of incontinence reviewed as well as treatment options.  As I think she has mixed incontinence, will start with out patient medication and PT.  Will reassess after 3 months. - mirabegron ER (MYRBETRIQ) 25 MG TB24 tablet; Take 1 tablet (25 mg total) by mouth daily. One po qd  Dispense: 30 tablet; Refill: 12 - Ambulatory referral to Physical Therapy  2. Irregular bleeding - treatment options reviewed.  Pt comfortable with conservative therapy at first.  If this does not lessen bleeding, will proceed with endometrial biopsy. - norethindrone (MICRONOR) 0.35 MG tablet; Take 1 tablet (0.35 mg total) by mouth daily.  Dispense: 28 tablet; Refill: 11  3. Joint swelling - Sedimentation rate - ANA - Rheumatoid Factor  Total time with pt: 46 minutes

## 2020-09-26 NOTE — Addendum Note (Signed)
Addended by: Jerene Bears on: 09/26/2020 04:55 PM   Modules accepted: Orders

## 2020-09-27 LAB — RHEUMATOID FACTOR: Rheumatoid fact SerPl-aCnc: <14 IU/mL (ref <14)

## 2020-09-27 LAB — SEDIMENTATION RATE: Sed Rate: 2 mm/h (ref 0–20)

## 2020-09-27 LAB — ANA: Anti Nuclear Antibody (ANA): NEGATIVE

## 2020-10-01 ENCOUNTER — Other Ambulatory Visit (HOSPITAL_BASED_OUTPATIENT_CLINIC_OR_DEPARTMENT_OTHER): Payer: Self-pay | Admitting: Obstetrics & Gynecology

## 2020-10-01 ENCOUNTER — Telehealth (HOSPITAL_BASED_OUTPATIENT_CLINIC_OR_DEPARTMENT_OTHER): Payer: Self-pay

## 2020-10-01 DIAGNOSIS — M2540 Effusion, unspecified joint: Secondary | ICD-10-CM

## 2020-10-01 NOTE — Telephone Encounter (Signed)
Called and spoke with patient about the lab results and recommendations. She expressed understanding. Patient would like for you to refer her to orthopedic. Please put the order in and we will get it scheduled. Thanks tbw

## 2020-10-03 ENCOUNTER — Other Ambulatory Visit (HOSPITAL_BASED_OUTPATIENT_CLINIC_OR_DEPARTMENT_OTHER): Payer: Self-pay | Admitting: Obstetrics & Gynecology

## 2020-10-03 ENCOUNTER — Encounter: Payer: Self-pay | Admitting: Osteopathic Medicine

## 2020-10-03 ENCOUNTER — Encounter (HOSPITAL_BASED_OUTPATIENT_CLINIC_OR_DEPARTMENT_OTHER): Payer: Self-pay

## 2020-10-03 DIAGNOSIS — F908 Attention-deficit hyperactivity disorder, other type: Secondary | ICD-10-CM

## 2020-10-03 MED ORDER — MEGESTROL ACETATE 20 MG PO TABS: 20.0000 mg | ORAL_TABLET | Freq: Two times a day (BID) | ORAL | 0 refills | Status: DC

## 2020-10-03 NOTE — Progress Notes (Signed)
Called pt.  Bleeding has increased.  Megace rx for 20mg  bid sent to pharmacy on file.  Will have pt return for endometrial biopsy.

## 2020-10-04 ENCOUNTER — Ambulatory Visit (INDEPENDENT_AMBULATORY_CARE_PROVIDER_SITE_OTHER): Payer: No Typology Code available for payment source

## 2020-10-04 ENCOUNTER — Other Ambulatory Visit: Payer: Self-pay

## 2020-10-04 DIAGNOSIS — Z1231 Encounter for screening mammogram for malignant neoplasm of breast: Secondary | ICD-10-CM | POA: Diagnosis not present

## 2020-10-04 MED ORDER — LISDEXAMFETAMINE DIMESYLATE 40 MG PO CAPS: 40.0000 mg | ORAL_CAPSULE | ORAL | 0 refills | Status: DC

## 2020-10-04 NOTE — Addendum Note (Signed)
Addended by: Chalmers Cater on: 10/04/2020 10:58 AM   Modules accepted: Orders

## 2020-10-04 NOTE — Telephone Encounter (Signed)
Apt scheduled 10/16/20 4pm. KW CMA

## 2020-10-04 NOTE — Addendum Note (Signed)
Addended by: Deirdre Pippins on: 10/04/2020 08:04 PM   Modules accepted: Orders

## 2020-10-05 NOTE — Telephone Encounter (Signed)
Called patient today to see how things are going since starting the medication. She states that the bleeding has gotten a lot better. She was not able to start medication the day that it was prescribed. She started taking it 10/04/2020 around noon. Since she started it, she sees a big difference. Advised patient to call the office if she has questions or concerns to arise. tbw

## 2020-10-15 ENCOUNTER — Encounter: Payer: Self-pay | Admitting: Orthopaedic Surgery

## 2020-10-15 ENCOUNTER — Ambulatory Visit: Payer: Self-pay

## 2020-10-15 ENCOUNTER — Ambulatory Visit: Payer: No Typology Code available for payment source | Admitting: Orthopaedic Surgery

## 2020-10-15 DIAGNOSIS — M79642 Pain in left hand: Secondary | ICD-10-CM

## 2020-10-15 DIAGNOSIS — M79641 Pain in right hand: Secondary | ICD-10-CM | POA: Diagnosis not present

## 2020-10-15 MED ORDER — MELOXICAM 15 MG PO TABS: 15.0000 mg | ORAL_TABLET | Freq: Every day | ORAL | 3 refills | Status: DC

## 2020-10-15 NOTE — Progress Notes (Signed)
Office Visit Note   Patient: Natalie Hopkins           Date of Birth: 1975/12/22           MRN: [id number] Visit Date: 10/15/2020              Requested by: Jerene Bears, MD [address] [place],  [place] [address] PCP: Sunnie Nielsen, DO   Assessment & Plan: Visit Diagnoses:  1. Pain in left hand   2. Pain in right hand     Plan: Upon further discussion with her, she has not been on any anti-inflammatories.  She does have a history of bariatric surgery and irritable bowel syndrome.  I would still like to start her on a topical Voltaren gel on her fingers at the MCP joints once to twice daily and just once daily meloxicam.  I will reevaluate her in 4 weeks.  This certainly could be a manifestation of Raynaud's disease and after the next visit we can decide whether or not we need to refer her to a rheumatologist.  All questions and concerns were answered and addressed.  Follow-Up Instructions: Return in about 4 weeks (around 11/12/2020).   Orders:  Orders Placed This Encounter  Procedures   XR Hand Complete Right   XR Hand Complete Left   Meds ordered this encounter  Medications   meloxicam (MOBIC) 15 MG tablet    Sig: Take 1 tablet (15 mg total) by mouth daily.    Dispense:  30 tablet    Refill:  3      Procedures: No procedures performed   Clinical Data: No additional findings.   Subjective: Chief Complaint  Patient presents with   Right Hand - Pain   Left Hand - Pain  The patient is a very pleasant 45 year old female referred from her primary care physician to evaluate bilateral hand swelling and complaints of pain and swelling and weakness in both her hands.  She denies any numbness and tingling.  She points to the MCP joints as a source of her pain and swelling on both hands.  She is right-hand dominant.  She is never had surgery on either hand.  She does not describe any numbness and tingling that wakes her up at night.  She does report a cold  sensation in both fingertips and feels like they do get ice cold.  Today she denies any other complaints and other joints.  She has had a full rheumatologic panel of labs which I did review and are all normal including rheumatoid factor and ANA.  Her sed rate was also normal.  This is been going on for about a year now.  She has had no other acute change in her medical status.  She is not a diabetic and not a smoker.  HPI  Review of Systems She currently denies any headache, chest pain, shortness of breath, fever, chills, nausea, vomiting  Objective: Vital Signs: There were no vitals taken for this visit.  Physical Exam She is alert and orient x3 and in no acute distress Ortho Exam Examination of both hands shows no deformity today of either hand.  Her fingertips are cool on both sides and her capillary refill is slightly sluggish.  I can palpate pulses in both radial and ulnar pulse of both wrists. she has normal pinch and grip strength.  Her range of motion of her fingers, thumb and wrist are all normal.  She has negative Phalen's and Tinel's exam.  There is no swelling in any of her joints. Specialty Comments:  No specialty comments available.  Imaging: XR Hand Complete Left  Result Date: 10/15/2020 3 views of the left hand showed no acute findings.  The joint spaces are all well-maintained.  XR Hand Complete Right  Result Date: 10/15/2020 3 views of the right hand show no acute findings.  The joint spaces are all well-maintained.    PMFS History: Patient Active Problem List   Diagnosis Date Noted   Mixed stress and urge urinary incontinence 02/16/2020   Amenorrhea 02/16/2020   Body mass index (BMI) of 40.1-44.9 in adult [facility]) 02/16/2020   Morbid obesity (HCC)    Attention deficit disorder 12/18/2017   Bipolar disorder (HCC) 12/18/2017   Depressive disorder 12/18/2017   Impaired fasting glucose 12/18/2017   Pancreatitis 12/18/2017   Sleep apnea 12/18/2017   Irritable bowel  syndrome 07/01/2016   Abdominal pain, epigastric 07/01/2016   Gastroesophageal reflux disease 07/01/2016   GAD (generalized anxiety disorder) 05/29/2016   Attention deficit hyperactivity disorder (ADHD), inattentive type, 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   Hiatal hernia 08/31/2014   Status post bariatric surgery 01/04/2014   MDD (major depressive disorder), recurrent episode, moderate (HCC) 10/04/2013   Migraines 10/04/2013   Hypertension 01/06/2013   Past Medical History:  Diagnosis Date   Anal fissure    Anemia    Anxiety    Arthritis    Back pain    Depression    Diverticulosis    on CT   Gallstones    GERD (gastroesophageal reflux disease)    Hypertension    Migraine    Morbid obesity (HCC)    Multiple gastric ulcers    Pancreatitis    Sleep apnea    Sleep apnea     Family History  Problem Relation Age of Onset   Asthma Father    Bladder Cancer Mother    Irregular heart beat Mother    Breast cancer Maternal Grandmother    Diabetes Paternal Grandfather    Ovarian cancer Maternal Aunt     Past Surgical History:  Procedure Laterality Date   BARIATRIC SURGERY  2014   Sleeve Gsatrectomy   CHOLECYSTECTOMY  2000   ECTOPIC PREGNANCY SURGERY     twice, with removal of 1 tube   TUBAL LIGATION     Social History   Occupational History   Occupation: Education officer, environmental  Tobacco Use   Smoking status: Never   Smokeless tobacco: Never  Vaping Use   Vaping Use: Never used  Substance and Sexual Activity   Alcohol use: Yes    Comment: once a month   Drug use: No   Sexual activity: Yes    Partners: Male    Birth control/protection: Surgical

## 2020-10-16 ENCOUNTER — Ambulatory Visit (HOSPITAL_BASED_OUTPATIENT_CLINIC_OR_DEPARTMENT_OTHER): Payer: No Typology Code available for payment source | Admitting: Obstetrics & Gynecology

## 2020-10-16 ENCOUNTER — Other Ambulatory Visit (HOSPITAL_COMMUNITY)
Admission: RE | Admit: 2020-10-16 | Discharge: 2020-10-16 | Disposition: A | Discharge status: Home / Self Care | Payer: No Typology Code available for payment source | Source: Ambulatory Visit | Attending: Obstetrics & Gynecology | Admitting: Obstetrics & Gynecology

## 2020-10-16 ENCOUNTER — Encounter (HOSPITAL_BASED_OUTPATIENT_CLINIC_OR_DEPARTMENT_OTHER): Payer: Self-pay | Admitting: Obstetrics & Gynecology

## 2020-10-16 ENCOUNTER — Other Ambulatory Visit: Payer: Self-pay

## 2020-10-16 VITALS — BP 140/92 | HR 101 | Wt 230.0 lb

## 2020-10-16 DIAGNOSIS — N3946 Mixed incontinence: Secondary | ICD-10-CM | POA: Diagnosis not present

## 2020-10-16 DIAGNOSIS — N926 Irregular menstruation, unspecified: Secondary | ICD-10-CM | POA: Diagnosis not present

## 2020-10-16 DIAGNOSIS — M2540 Effusion, unspecified joint: Secondary | ICD-10-CM | POA: Diagnosis not present

## 2020-10-16 NOTE — Progress Notes (Signed)
GYNECOLOGY  VISIT  CC:   endometrial biopsy, follow up for problems discussed at last visit  HPI: 45 y.o. G16P3020 Married White or Caucasian female here for endometrial biopsy due to risks for endometrial abnormality with 3mm endometrium, irregular bleeding, and BMI >40.  Pt has undergone a BTL.  Procedure, risks and benefits reviewed.  Pt has seen Dr. Rayburn Ma in ortho (referral placed after initial visit with me).  Hand x ray done.  Has been started on anti-inflammatories.  Has follow up scheduled.  If not improved, he is likely going to refer her to rheumatology for additional work up.  Labs with me (not extensive) were normal.  She is on myrbetriq 25mg .  Hasn't noticed much improvement.  Is going to increase to 50mg  daily.  Will just double what she has.  She will call if needs RF and/or referral to urogynecology.  She has been referred to pelvic PT.  GYNECOLOGIC HISTORY: Patient's last menstrual period was 09/18/2020. Contraception: BTl Menopausal hormone therapy: none  Patient Active Problem List   Diagnosis Date Noted   Mixed stress and urge urinary incontinence 02/16/2020   Amenorrhea 02/16/2020   Body mass index (BMI) of 40.1-44.9 in adult [facility]) 02/16/2020   Morbid obesity (HCC)    Attention deficit disorder 12/18/2017   Bipolar disorder (HCC) 12/18/2017   Depressive disorder 12/18/2017   Impaired fasting glucose 12/18/2017   Pancreatitis 12/18/2017   Sleep apnea 12/18/2017   Irritable bowel syndrome 07/01/2016   Abdominal pain, epigastric 07/01/2016   Gastroesophageal reflux disease 07/01/2016   GAD (generalized anxiety disorder) 05/29/2016   Attention deficit hyperactivity disorder (ADHD), inattentive type, 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   Hiatal hernia 08/31/2014   Status post bariatric surgery 01/04/2014   MDD (major depressive disorder), recurrent episode, moderate (HCC) 10/04/2013   Migraines 10/04/2013   Hypertension 01/06/2013    Past Medical History:  Diagnosis Date    Anal fissure    Anemia    Anxiety    Arthritis    Back pain    Depression    Diverticulosis    on CT   Gallstones    GERD (gastroesophageal reflux disease)    Hypertension    Migraine    Morbid obesity (HCC)    Multiple gastric ulcers    Pancreatitis    Sleep apnea    Sleep apnea     Past Surgical History:  Procedure Laterality Date   BARIATRIC SURGERY  2014   Sleeve Gsatrectomy   CHOLECYSTECTOMY  2000   ECTOPIC PREGNANCY SURGERY     twice, with removal of 1 tube   TUBAL LIGATION      MEDS:   Current Outpatient Medications on File Prior to Visit  Medication Sig Dispense Refill   buPROPion (WELLBUTRIN XL) 150 MG 24 hr tablet Take 1 tab (150mg ) by mouth daily for the first 7 days then increase to full dose of 2 tabs (300mg ) by mouth daily. 60 tablet 3   buPROPion (WELLBUTRIN XL) 300 MG 24 hr tablet bupropion HCl XL 300 mg 24 hr tablet, extended release     ergocalciferol (VITAMIN D2) 1.25 MG (50000 UT) capsule ergocalciferol (vitamin D2) 1,250 mcg (50,000 unit) capsule  TK 1 C PO Q WK     fexofenadine (ALLEGRA ALLERGY) 180 MG tablet Take 1 tablet (180 mg total) by mouth daily. 90 tablet 3   FLUoxetine (PROZAC) 10 MG capsule Take 1 capsule (10 mg total) by mouth daily. 90 capsule 3   fluticasone (FLONASE) 50 MCG/ACT nasal spray  fluticasone propionate 50 mcg/actuation nasal spray,suspension  SHAKE LQ AND U 1 SPR IEN HS     ipratropium (ATROVENT) 0.06 % nasal spray Place 2 sprays into both nostrils 4 (four) times daily. As needed for runny nose / postnasal drip 15 mL 1   lisdexamfetamine (VYVANSE) 40 MG capsule Take 1 capsule (40 mg total) by mouth every morning. 30 capsule 0   megestrol (MEGACE) 20 MG tablet Take 1 tablet (20 mg total) by mouth 2 (two) times daily. 60 tablet 0   oxybutynin (DITROPAN-XL) 5 MG 24 hr tablet Take 1 tablet (5 mg total) by mouth at bedtime. 30 tablet 2   traZODone (DESYREL) 100 MG tablet Take 100 mg by mouth at bedtime.     vitamin B-12  (CYANOCOBALAMIN) 100 MCG tablet Take 50 mcg by mouth daily.     Dexlansoprazole 30 MG capsule Take 30 mg by mouth daily. (Patient not taking: Reported on 10/16/2020)     montelukast (SINGULAIR) 10 MG tablet Take 1 tablet (10 mg total) by mouth at bedtime. (Patient not taking: Reported on 10/16/2020) 90 tablet 3   norethindrone (MICRONOR) 0.35 MG tablet Take 1 tablet (0.35 mg total) by mouth daily. (Patient not taking: Reported on 10/16/2020) 28 tablet 11   No current facility-administered medications on file prior to visit.    ALLERGIES: Patient has no known allergies.  Family History  Problem Relation Age of Onset   Asthma Father    Bladder Cancer Mother    Irregular heart beat Mother    Breast cancer Maternal Grandmother    Diabetes Paternal Grandfather    Ovarian cancer Maternal Aunt     SH:  married, non smoker  Review of Systems  Constitutional: Negative.   Genitourinary:  Positive for menstrual problem.   PHYSICAL EXAMINATION:    BP (!) 140/92   Pulse (!) 101   Wt 230 lb (104.3 kg)   LMP 09/18/2020   BMI 40.74 kg/m     General appearance: alert, cooperative and appears stated age Lymph:  no inguinal LAD noted  Pelvic: External genitalia:  no lesions              Urethra:  normal appearing urethra with no masses, tenderness or lesions              Bartholins and Skenes: normal                 Vagina: normal appearing vagina with normal color and discharge, no lesions              Cervix: no lesions              Bimanual Exam:  Uterus:  normal size, contour, position, consistency, mobility, non-tender              Adnexa: no mass, fullness, tenderness  Endometrial biopsy recommended.  Discussed with patient.  Verbal and written consent obtained.   Procedure:  Speculum placed.  Cervix visualized and cleansed with betadine prep.  A single toothed tenaculum was applied to the anterior lip of the cervix.  Endometrial pipelle was advanced through the cervix into the  endometrial cavity without difficulty.  Pipelle passed to 9cm.  Suction applied and pipelle removed with good tissue sample obtained.  Tenculum removed.  No bleeding noted.  Patient tolerated procedure well.  Chaperone, Ina Homes, CMA, was present for exam.  Assessment/Plan: 1. Irregular bleeding - Surgical pathology( [place]/ [facility])  2. Joint swelling - will follow up  with Dr. Rayburn Ma in about a month.  On anti-inflammatories at this time.  3. Mixed stress and urge urinary incontinence - will increase myrbetriq to 50mg  daily.  BP is mildly increased today so reasonable to change dosage as htn risk is higher with the 25mg  dosage.  Pt will double and call if/when needs RF. - She is going to start pelvic PT

## 2020-10-17 ENCOUNTER — Encounter (HOSPITAL_BASED_OUTPATIENT_CLINIC_OR_DEPARTMENT_OTHER): Payer: Self-pay | Admitting: Obstetrics & Gynecology

## 2020-10-18 LAB — SURGICAL PATHOLOGY

## 2020-10-23 ENCOUNTER — Encounter (HOSPITAL_BASED_OUTPATIENT_CLINIC_OR_DEPARTMENT_OTHER): Payer: Self-pay

## 2020-10-23 ENCOUNTER — Other Ambulatory Visit: Payer: Self-pay | Admitting: Osteopathic Medicine

## 2020-10-23 ENCOUNTER — Encounter: Payer: Self-pay | Admitting: Osteopathic Medicine

## 2020-10-23 DIAGNOSIS — F908 Attention-deficit hyperactivity disorder, other type: Secondary | ICD-10-CM

## 2020-10-23 MED ORDER — LISDEXAMFETAMINE DIMESYLATE 40 MG PO CAPS: 40.0000 mg | ORAL_CAPSULE | ORAL | 0 refills | Status: DC

## 2020-11-12 ENCOUNTER — Ambulatory Visit: Payer: No Typology Code available for payment source | Admitting: Orthopaedic Surgery

## 2020-11-15 ENCOUNTER — Ambulatory Visit: Payer: No Typology Code available for payment source | Admitting: Physical Therapy

## 2020-11-27 ENCOUNTER — Telehealth: Payer: Self-pay | Admitting: Physical Therapy

## 2020-11-27 ENCOUNTER — Ambulatory Visit: Payer: No Typology Code available for payment source | Attending: Obstetrics & Gynecology | Admitting: Physical Therapy

## 2020-11-27 NOTE — Telephone Encounter (Signed)
This PT attempted to call pt regarding pt's no show for 8am evaluation appointment. There was no answer, unable to leave message with voicemail not initiating.   Otelia Sergeant, PT 11/27/2209:31 AM

## 2021-01-03 ENCOUNTER — Ambulatory Visit (HOSPITAL_BASED_OUTPATIENT_CLINIC_OR_DEPARTMENT_OTHER): Payer: No Typology Code available for payment source | Admitting: Obstetrics & Gynecology

## 2021-01-21 ENCOUNTER — Other Ambulatory Visit: Payer: Self-pay

## 2021-01-21 ENCOUNTER — Ambulatory Visit (HOSPITAL_BASED_OUTPATIENT_CLINIC_OR_DEPARTMENT_OTHER): Payer: No Typology Code available for payment source | Admitting: Obstetrics & Gynecology

## 2021-01-21 ENCOUNTER — Encounter (HOSPITAL_BASED_OUTPATIENT_CLINIC_OR_DEPARTMENT_OTHER): Payer: Self-pay | Admitting: Obstetrics & Gynecology

## 2021-01-21 VITALS — BP 154/91 | HR 90 | Ht 63.0 in | Wt 232.0 lb

## 2021-01-21 DIAGNOSIS — N926 Irregular menstruation, unspecified: Secondary | ICD-10-CM

## 2021-01-21 NOTE — Progress Notes (Signed)
GYNECOLOGY  VISIT  CC:   medication recheck  HPI: 45 y.o. G80P3020 Married White or Caucasian female here for recheck after starting micronor for amenorrhea up to several months at a time and myrbetriq for mixed urinary incontinence.  Pt stopped the micronor due to feeling moody and having irregular bleeding with it.  She started in July and kept taking in August.  Once stopping, she did have some bleeding but hasn't had any since.  Thinks it's been about 2 months without any bleeding.  Mood changes have resolved as well.  Not interested in an IUD.  She really didn't feel normal with hormonal therapy so not sure wants to start anything long term at this point.    Reports she took the myrbetriq for two weeks but then stopped.  Reports sometimes her symptoms are better and sometimes they are worse.  Is drinking a Dr. Reino Kent today in the office.  Discussed with pt bladder triggers for urgnecy (caffeine, alcohol, carbonation) to see if she can see any patterns.  Was referred to PT but she has not scheduled yet.  States she needs to call back.  Referral is still open at this point so she knows she needs do to this.   Pt was having a lot of issues with joint pain.  Did see Dr. Rayburn Ma and had x rays.  Started on meloxicam.  Reports this really helped but ran out.  Advised there are 3 more refills for her so doesn't need a refill now.  Pt was unaware.  Will call pharmacy for refill.    H/o elevated FSH (20) about two years ago. Will draw today.  Endometrial biopsy did not show any tissue abnormal.  Polyp was present.  Ultrasound showed 34mm endometrium but no endometrial lesion noted.  Patient Active Problem List   Diagnosis Date Noted   Mixed stress and urge urinary incontinence 02/16/2020   Amenorrhea 02/16/2020   Body mass index (BMI) of 40.1-44.9 in adult [facility]) 02/16/2020   Morbid obesity (HCC)    Attention deficit disorder 12/18/2017   Bipolar disorder (HCC) 12/18/2017   Depressive disorder  12/18/2017   Impaired fasting glucose 12/18/2017   Pancreatitis 12/18/2017   Sleep apnea 12/18/2017   Irritable bowel syndrome 07/01/2016   Abdominal pain, epigastric 07/01/2016   Gastroesophageal reflux disease 07/01/2016   GAD (generalized anxiety disorder) 05/29/2016   Attention deficit hyperactivity disorder (ADHD), inattentive type, 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   Hiatal hernia 08/31/2014   Status post bariatric surgery 01/04/2014   MDD (major depressive disorder), recurrent episode, moderate (HCC) 10/04/2013   Migraines 10/04/2013   Hypertension 01/06/2013    Past Medical History:  Diagnosis Date   Anal fissure    Anemia    Anxiety    Arthritis    Back pain    Depression    Diverticulosis    on CT   Gallstones    GERD (gastroesophageal reflux disease)    Hypertension    Migraine    Morbid obesity (HCC)    Multiple gastric ulcers    Pancreatitis    Sleep apnea    Sleep apnea     Past Surgical History:  Procedure Laterality Date   BARIATRIC SURGERY  2014   Sleeve Gsatrectomy   CHOLECYSTECTOMY  2000   ECTOPIC PREGNANCY SURGERY     twice, with removal of 1 tube   TUBAL LIGATION      MEDS:   Current Outpatient Medications on File Prior to Visit  Medication Sig Dispense Refill  buPROPion (WELLBUTRIN XL) 300 MG 24 hr tablet bupropion HCl XL 300 mg 24 hr tablet, extended release     ergocalciferol (VITAMIN D2) 1.25 MG (50000 UT) capsule ergocalciferol (vitamin D2) 1,250 mcg (50,000 unit) capsule  TK 1 C PO Q WK     fexofenadine (ALLEGRA ALLERGY) 180 MG tablet Take 1 tablet (180 mg total) by mouth daily. 90 tablet 3   FLUoxetine (PROZAC) 10 MG capsule Take 1 capsule (10 mg total) by mouth daily. 90 capsule 3   fluticasone (FLONASE) 50 MCG/ACT nasal spray fluticasone propionate 50 mcg/actuation nasal spray,suspension  SHAKE LQ AND U 1 SPR IEN HS     ipratropium (ATROVENT) 0.06 % nasal spray Place 2 sprays into both nostrils 4 (four) times daily. As needed for runny nose /  postnasal drip 15 mL 1   lisdexamfetamine (VYVANSE) 40 MG capsule Take 1 capsule (40 mg total) by mouth every morning. 30 capsule 0   traZODone (DESYREL) 100 MG tablet Take 100 mg by mouth at bedtime.     vitamin B-12 (CYANOCOBALAMIN) 100 MCG tablet Take 50 mcg by mouth daily.     buPROPion (WELLBUTRIN XL) 150 MG 24 hr tablet Take 1 tab (150mg ) by mouth daily for the first 7 days then increase to full dose of 2 tabs (300mg ) by mouth daily. (Patient not taking: Reported on 01/21/2021) 60 tablet 3   Dexlansoprazole 30 MG capsule Take 30 mg by mouth daily. (Patient not taking: No sig reported)     megestrol (MEGACE) 20 MG tablet Take 1 tablet (20 mg total) by mouth 2 (two) times daily. (Patient not taking: Reported on 01/21/2021) 60 tablet 0   montelukast (SINGULAIR) 10 MG tablet Take 1 tablet (10 mg total) by mouth at bedtime. (Patient not taking: Reported on 01/21/2021) 90 tablet 3   No current facility-administered medications on file prior to visit.    ALLERGIES: Patient has no known allergies.  Family History  Problem Relation Age of Onset   Asthma Father    Bladder Cancer Mother    Irregular heart beat Mother    Breast cancer Maternal Grandmother    Diabetes Paternal Grandfather    Ovarian cancer Maternal Aunt     SH:  married, non smoker  Review of Systems  Constitutional: Negative.   Genitourinary:  Positive for menstrual problem.   PHYSICAL EXAMINATION:    BP (!) 154/91 (BP Location: Left Arm, Patient Position: Sitting, Cuff Size: Large)   Pulse 90   Ht 5\' 3"  (1.6 m)   Wt 232 lb (105.2 kg)   BMI 41.10 kg/m     General appearance: alert, cooperative and appears stated age No other physical exam performed today  Assessment/Plan: 1. Irregular periods/menstrual cycles - will check FSH.  If not in menopausal range, will plan cyclic provera challenge to help pt cycle more regularly - Follicle stimulating hormone  2.  Mixed incontinence - pt advised to monitor diet to see  if can see causes for worsening urgency at times.  Typical triggers discussed.  - PT follow up encouraged   Total time with pt and documentation:  20 minutes

## 2021-01-22 LAB — FOLLICLE STIMULATING HORMONE: FSH: 5.9 m[IU]/mL

## 2021-01-25 ENCOUNTER — Encounter (HOSPITAL_BASED_OUTPATIENT_CLINIC_OR_DEPARTMENT_OTHER): Payer: Self-pay

## 2021-01-30 ENCOUNTER — Telehealth (INDEPENDENT_AMBULATORY_CARE_PROVIDER_SITE_OTHER): Payer: No Typology Code available for payment source | Admitting: Obstetrics & Gynecology

## 2021-01-30 ENCOUNTER — Encounter (HOSPITAL_BASED_OUTPATIENT_CLINIC_OR_DEPARTMENT_OTHER): Payer: Self-pay | Admitting: Obstetrics & Gynecology

## 2021-01-30 ENCOUNTER — Other Ambulatory Visit: Payer: Self-pay

## 2021-01-30 ENCOUNTER — Encounter (HOSPITAL_BASED_OUTPATIENT_CLINIC_OR_DEPARTMENT_OTHER): Payer: Self-pay

## 2021-01-30 DIAGNOSIS — N921 Excessive and frequent menstruation with irregular cycle: Secondary | ICD-10-CM

## 2021-01-30 NOTE — Progress Notes (Signed)
Virtual Visit via Video Note  I connected with Natalie Hopkins on 01/30/21 at  1:15 PM EDT by a video enabled telemedicine application and verified that I am speaking with the correct person using two identifiers.  Location: Patient: car Provider: office   I discussed the limitations of evaluation and management by telemedicine and the availability of in person appointments. The patient expressed understanding and agreed to proceed.  History of Present Illness: 45 yo G5P3 MWF with menorrhagia with irregular menstrual cycles.  She does have a hx of hypertension so I have been uncomfortable considering a combination OCP.  Pt did try micronor but stopped it due to the progesterone side effects.  She reports her most recent cycle started on 01/22/2021.  It was heavy and with thick blood with clots.  The first two days were very heavy.  She really does not want to be on hormonal therapy.  Pt has hx of ectopic pregnancy with removal of one tube and tubal ligation on the other side.  She does not want any future child bearing.  We discussed IUD use but this will be a medical benefit for her so will need precert.  We also discussed hysteroscopy with Novasure ablation.  Pt is interested in hormonal free options.  Bleeding reduction and amenorrhea rates discussed.  We also discussed contraindications for use.  She is aware she should never be pregnant again if decides to proceed.  Recovery and pain management discussed.  Other risks discussed including but not limited to bleeding, <1% risk of uterine perforation with associated risks due to perforation also discussed.  Questions answered.  Pt would like to proceed with scheduling.   Observations/Objective: WNWD WF, NAD  Assessment and Plan: 1. Menorrhagia with irregular cycle - will proceed with presurgical planning - endometrial biopsy 09/2020 negaitve   Follow Up Instructions: I discussed the assessment and treatment plan with the patient. The patient  was provided an opportunity to ask questions and all were answered. The patient agreed with the plan and demonstrated an understanding of the instructions.   The patient was advised to call back or seek an in-person evaluation if the symptoms worsen or if the condition fails to improve as anticipated.  I provided 21 minutes of non-face-to-face time during this encounter.   Jerene Bears, MD

## 2021-01-31 ENCOUNTER — Telehealth: Payer: Self-pay | Admitting: *Deleted

## 2021-01-31 NOTE — Telephone Encounter (Signed)
Call to patient. Surgery date options discussed and plan to proceed on 03-06-21. Surgery instructions reviewed and advised will also send via My Chart.  Encounter closed.

## 2021-02-05 ENCOUNTER — Other Ambulatory Visit (HOSPITAL_BASED_OUTPATIENT_CLINIC_OR_DEPARTMENT_OTHER): Payer: Self-pay | Admitting: Obstetrics & Gynecology

## 2021-02-05 DIAGNOSIS — Z01818 Encounter for other preprocedural examination: Secondary | ICD-10-CM

## 2021-02-11 ENCOUNTER — Encounter (HOSPITAL_BASED_OUTPATIENT_CLINIC_OR_DEPARTMENT_OTHER): Payer: Self-pay | Admitting: Nurse Practitioner

## 2021-02-11 DIAGNOSIS — F908 Attention-deficit hyperactivity disorder, other type: Secondary | ICD-10-CM

## 2021-02-11 MED ORDER — LISDEXAMFETAMINE DIMESYLATE 40 MG PO CAPS: 40.0000 mg | ORAL_CAPSULE | ORAL | 0 refills | Status: DC

## 2021-02-11 NOTE — Telephone Encounter (Signed)
LVM for pt to call to discuss need for OV to establish care with another provider.  Tiajuana Amass, CMA

## 2021-02-11 NOTE — Telephone Encounter (Signed)
Patient requesting refill on Vyvanse but has not been seen in our office since February 2022. Will need appointment to establish with a new PCP for further refills of a controlled substance.

## 2021-02-28 ENCOUNTER — Other Ambulatory Visit (HOSPITAL_BASED_OUTPATIENT_CLINIC_OR_DEPARTMENT_OTHER): Payer: Self-pay | Admitting: Obstetrics & Gynecology

## 2021-03-01 ENCOUNTER — Other Ambulatory Visit: Payer: Self-pay

## 2021-03-01 ENCOUNTER — Encounter (HOSPITAL_BASED_OUTPATIENT_CLINIC_OR_DEPARTMENT_OTHER): Payer: Self-pay | Admitting: Obstetrics & Gynecology

## 2021-03-01 NOTE — Progress Notes (Signed)
Spoke w/ via phone for pre-op interview---pt  Lab needs dos---- UPT             Lab results------T&S, CBC, BMP COVID test -----patient states asymptomatic no test needed Arrive at -------11 am11/16/2022 NPO after MN NO Solid Food.  Clear liquids from MN until---7 am 03/06/2021 Med rec completed Medications to take morning of surgery -----wellbutrin, Prozac, vyvanse, prilosec Diabetic medication -----n/a Patient instructed no nail polish to be worn day of surgery Patient instructed to bring photo id and insurance card day of surgery Patient aware to have Driver (ride ) / caregiver Husband Fayrene Fearing   for 24 hours after surgery  Patient Special Instructions -----n/a Pre-Op special Istructions -----n/a Patient verbalized understanding of instructions that were given at this phone interview. Patient denies shortness of breath, chest pain, fever, cough at this phone interview.

## 2021-03-04 ENCOUNTER — Encounter (HOSPITAL_COMMUNITY)
Admission: RE | Admit: 2021-03-04 | Discharge: 2021-03-04 | Disposition: A | Discharge status: Home / Self Care | Payer: No Typology Code available for payment source | Source: Ambulatory Visit | Attending: Obstetrics & Gynecology | Admitting: Obstetrics & Gynecology

## 2021-03-04 DIAGNOSIS — Z01818 Encounter for other preprocedural examination: Secondary | ICD-10-CM

## 2021-03-04 DIAGNOSIS — Z01812 Encounter for preprocedural laboratory examination: Secondary | ICD-10-CM | POA: Diagnosis not present

## 2021-03-04 LAB — BASIC METABOLIC PANEL
Anion gap: 8 (ref 5–15)
BUN: 26 mg/dL — ABNORMAL HIGH (ref 6–20)
CO2: 24 mmol/L (ref 22–32)
Calcium: 9.8 mg/dL (ref 8.9–10.3)
Chloride: 103 mmol/L (ref 98–111)
Creatinine, Ser: 0.63 mg/dL (ref 0.44–1.00)
GFR, Estimated: >60 mL/min (ref >60)
Glucose, Bld: 93 mg/dL (ref 70–99)
Potassium: 4.5 mmol/L (ref 3.5–5.1)
Sodium: 135 mmol/L (ref 135–145)

## 2021-03-04 LAB — CBC
HCT: 40.5 % (ref 36.0–46.0)
Hemoglobin: 13.3 g/dL (ref 12.0–15.0)
MCH: 27.5 pg (ref 26.0–34.0)
MCHC: 32.8 g/dL (ref 30.0–36.0)
MCV: 83.7 fL (ref 80.0–100.0)
Platelets: 239 10*3/uL (ref 150–400)
RBC: 4.84 MIL/uL (ref 3.87–5.11)
RDW: 12.8 % (ref 11.5–15.5)
WBC: 6.4 10*3/uL (ref 4.0–10.5)
nRBC: 0 % (ref 0.0–0.2)

## 2021-03-05 NOTE — Progress Notes (Signed)
Patient notified of time change for surgery tomorrow. To arrive at 0800. NPO after MN except for clear liquids until 0700.

## 2021-03-05 NOTE — Anesthesia Preprocedure Evaluation (Addendum)
Anesthesia Evaluation  Patient identified by MRN, date of birth, ID band Patient awake    Reviewed: Allergy & Precautions, NPO status , Patient's Chart, lab work & pertinent test results  Airway Mallampati: II  TM Distance: >3 FB Neck ROM: Full    Dental no notable dental hx. (+) Teeth Intact, Dental Advisory Given   Pulmonary sleep apnea ,    Pulmonary exam normal breath sounds clear to auscultation       Cardiovascular Exercise Tolerance: Good hypertension, Pt. on medications Normal cardiovascular exam Rhythm:Regular Rate:Normal     Neuro/Psych  Headaches, Bipolar Disorder    GI/Hepatic Neg liver ROS, hiatal hernia, PUD, GERD  ,  Endo/Other  negative endocrine ROSMorbid obesity (BMI 41.45)  Renal/GU Lab Results      Component                Value               Date                      CREATININE               0.63                03/04/2021                BUN                      26 (H)              03/04/2021                NA                       135                 03/04/2021                K                        4.5                 03/04/2021                CL                       103                 03/04/2021                CO2                      24                  03/04/2021                Musculoskeletal  (+) Arthritis ,   Abdominal (+) + obese,   Peds  Hematology  (+) anemia , Lab Results      Component                Value               Date                      WBC  6.4                 03/04/2021                HGB                      13.3                03/04/2021                HCT                      40.5                03/04/2021                MCV                      83.7                03/04/2021                PLT                      239                 03/04/2021              Anesthesia Other Findings   Reproductive/Obstetrics negative OB ROS                             Anesthesia Physical Anesthesia Plan  ASA: 3  Anesthesia Plan: General   Post-op Pain Management:    Induction: Intravenous  PONV Risk Score and Plan: Treatment may vary due to age or medical condition, Midazolam, Dexamethasone and Ondansetron  Airway Management Planned: LMA  Additional Equipment: None  Intra-op Plan:   Post-operative Plan:   Informed Consent: I have reviewed the patients History and Physical, chart, labs and discussed the procedure including the risks, benefits and alternatives for the proposed anesthesia with the patient or authorized representative who has indicated his/her understanding and acceptance.     Dental advisory given  Plan Discussed with: CRNA and Anesthesiologist  Anesthesia Plan Comments:        Anesthesia Quick Evaluation

## 2021-03-06 ENCOUNTER — Ambulatory Visit (HOSPITAL_BASED_OUTPATIENT_CLINIC_OR_DEPARTMENT_OTHER)
Admission: RE | Admit: 2021-03-06 | Discharge: 2021-03-06 | Disposition: A | Discharge status: Home / Self Care | Payer: No Typology Code available for payment source | Attending: Obstetrics & Gynecology | Admitting: Obstetrics & Gynecology

## 2021-03-06 ENCOUNTER — Encounter (HOSPITAL_BASED_OUTPATIENT_CLINIC_OR_DEPARTMENT_OTHER): Payer: Self-pay | Admitting: Obstetrics & Gynecology

## 2021-03-06 ENCOUNTER — Encounter (HOSPITAL_BASED_OUTPATIENT_CLINIC_OR_DEPARTMENT_OTHER)
Admission: RE | Disposition: A | Discharge status: Home / Self Care | Payer: Self-pay | Source: Home / Self Care | Attending: Obstetrics & Gynecology

## 2021-03-06 ENCOUNTER — Ambulatory Visit (HOSPITAL_BASED_OUTPATIENT_CLINIC_OR_DEPARTMENT_OTHER): Payer: No Typology Code available for payment source | Admitting: Anesthesiology

## 2021-03-06 DIAGNOSIS — Z79899 Other long term (current) drug therapy: Secondary | ICD-10-CM | POA: Diagnosis not present

## 2021-03-06 DIAGNOSIS — Z6841 Body Mass Index (BMI) 40.0 and over, adult: Secondary | ICD-10-CM | POA: Diagnosis not present

## 2021-03-06 DIAGNOSIS — N816 Rectocele: Secondary | ICD-10-CM | POA: Insufficient documentation

## 2021-03-06 DIAGNOSIS — N840 Polyp of corpus uteri: Secondary | ICD-10-CM | POA: Insufficient documentation

## 2021-03-06 DIAGNOSIS — F319 Bipolar disorder, unspecified: Secondary | ICD-10-CM | POA: Diagnosis not present

## 2021-03-06 DIAGNOSIS — Z01818 Encounter for other preprocedural examination: Secondary | ICD-10-CM

## 2021-03-06 DIAGNOSIS — N921 Excessive and frequent menstruation with irregular cycle: Secondary | ICD-10-CM | POA: Diagnosis not present

## 2021-03-06 DIAGNOSIS — I10 Essential (primary) hypertension: Secondary | ICD-10-CM | POA: Diagnosis not present

## 2021-03-06 DIAGNOSIS — G4730 Sleep apnea, unspecified: Secondary | ICD-10-CM | POA: Insufficient documentation

## 2021-03-06 DIAGNOSIS — K219 Gastro-esophageal reflux disease without esophagitis: Secondary | ICD-10-CM | POA: Diagnosis not present

## 2021-03-06 HISTORY — PX: HYSTEROSCOPY WITH NOVASURE: SHX5574

## 2021-03-06 HISTORY — DX: Attention-deficit hyperactivity disorder, unspecified type: F90.9

## 2021-03-06 HISTORY — DX: Prediabetes: R73.03

## 2021-03-06 LAB — ABO/RH: ABO/RH(D): O POS

## 2021-03-06 LAB — POCT PREGNANCY, URINE: Preg Test, Ur: NEGATIVE

## 2021-03-06 LAB — TYPE AND SCREEN
ABO/RH(D): O POS
Antibody Screen: NEGATIVE

## 2021-03-06 SURGERY — HYSTEROSCOPY WITH NOVASURE
Anesthesia: General | Site: Vagina

## 2021-03-06 MED ORDER — SODIUM CHLORIDE 0.9 % IR SOLN
Status: DC | PRN
  Administered 2021-03-06: 3000 mL

## 2021-03-06 MED ORDER — OXYCODONE HCL 5 MG/5ML PO SOLN: 5.0000 mg | Freq: Once | ORAL | Status: DC | PRN

## 2021-03-06 MED ORDER — KETOROLAC TROMETHAMINE 30 MG/ML IJ SOLN: 30.0000 mg | Freq: Once | INTRAMUSCULAR | Status: DC | PRN

## 2021-03-06 MED ORDER — PROPOFOL 10 MG/ML IV BOLUS
INTRAVENOUS | Status: AC
  Filled 2021-03-06: qty 40

## 2021-03-06 MED ORDER — FENTANYL CITRATE (PF) 100 MCG/2ML IJ SOLN
INTRAMUSCULAR | Status: AC
  Filled 2021-03-06: qty 2

## 2021-03-06 MED ORDER — LIDOCAINE 2% (20 MG/ML) 5 ML SYRINGE
INTRAMUSCULAR | Status: AC
  Filled 2021-03-06: qty 5

## 2021-03-06 MED ORDER — ONDANSETRON HCL 4 MG/2ML IJ SOLN: 4.0000 mg | Freq: Once | INTRAMUSCULAR | Status: DC | PRN

## 2021-03-06 MED ORDER — LIDOCAINE 2% (20 MG/ML) 5 ML SYRINGE
INTRAMUSCULAR | Status: DC | PRN
  Administered 2021-03-06: 100 mg via INTRAVENOUS

## 2021-03-06 MED ORDER — MIDAZOLAM HCL 5 MG/5ML IJ SOLN
INTRAMUSCULAR | Status: DC | PRN
  Administered 2021-03-06: 2 mg via INTRAVENOUS

## 2021-03-06 MED ORDER — FENTANYL CITRATE (PF) 100 MCG/2ML IJ SOLN
INTRAMUSCULAR | Status: DC | PRN
  Administered 2021-03-06: 50 ug via INTRAVENOUS
  Administered 2021-03-06 (×2): 25 ug via INTRAVENOUS

## 2021-03-06 MED ORDER — HYDROCODONE-ACETAMINOPHEN 5-325 MG PO TABS: 1.0000 | ORAL_TABLET | ORAL | 0 refills | Status: DC | PRN

## 2021-03-06 MED ORDER — GLYCOPYRROLATE 0.2 MG/ML IJ SOLN
INTRAMUSCULAR | Status: DC | PRN
  Administered 2021-03-06: .1 mg via INTRAVENOUS

## 2021-03-06 MED ORDER — ONDANSETRON HCL 4 MG/2ML IJ SOLN
INTRAMUSCULAR | Status: DC | PRN
  Administered 2021-03-06: 4 mg via INTRAVENOUS

## 2021-03-06 MED ORDER — POVIDONE-IODINE 10 % EX SWAB: 2.0000 "application " | Freq: Once | CUTANEOUS | Status: DC

## 2021-03-06 MED ORDER — PROPOFOL 10 MG/ML IV BOLUS
INTRAVENOUS | Status: DC | PRN
  Administered 2021-03-06: 180 mg via INTRAVENOUS

## 2021-03-06 MED ORDER — DEXAMETHASONE SODIUM PHOSPHATE 10 MG/ML IJ SOLN
INTRAMUSCULAR | Status: DC | PRN
  Administered 2021-03-06: 10 mg via INTRAVENOUS

## 2021-03-06 MED ORDER — OXYCODONE HCL 5 MG PO TABS: 5.0000 mg | ORAL_TABLET | Freq: Once | ORAL | Status: DC | PRN

## 2021-03-06 MED ORDER — LIDOCAINE-EPINEPHRINE 1 %-1:100000 IJ SOLN
INTRAMUSCULAR | Status: DC | PRN
  Administered 2021-03-06: 10 mL

## 2021-03-06 MED ORDER — HYDROMORPHONE HCL 1 MG/ML IJ SOLN: 0.2500 mg | INTRAMUSCULAR | Status: DC | PRN

## 2021-03-06 MED ORDER — MIDAZOLAM HCL 2 MG/2ML IJ SOLN
INTRAMUSCULAR | Status: AC
  Filled 2021-03-06: qty 2

## 2021-03-06 MED ORDER — LACTATED RINGERS IV SOLN: INTRAVENOUS | Status: DC

## 2021-03-06 MED ORDER — KETOROLAC TROMETHAMINE 30 MG/ML IJ SOLN
INTRAMUSCULAR | Status: DC | PRN
  Administered 2021-03-06: 30 mg via INTRAVENOUS

## 2021-03-06 MED ORDER — IBUPROFEN 800 MG PO TABS: 800.0000 mg | ORAL_TABLET | Freq: Three times a day (TID) | ORAL | 0 refills | Status: DC | PRN

## 2021-03-06 MED ORDER — KETOROLAC TROMETHAMINE 30 MG/ML IJ SOLN
INTRAMUSCULAR | Status: AC
  Filled 2021-03-06: qty 1

## 2021-03-06 SURGICAL SUPPLY — 21 items
ABLATOR SURESOUND NOVASURE (ABLATOR) ×2 IMPLANT
CATH ROBINSON RED A/P 16FR (CATHETERS) ×2 IMPLANT
CNTNR URN SCR LID CUP LEK RST (MISCELLANEOUS) IMPLANT
CONT SPEC 4OZ STRL OR WHT (MISCELLANEOUS)
DILATOR CANAL MILEX (MISCELLANEOUS) IMPLANT
DRSG TELFA 3X8 NADH (GAUZE/BANDAGES/DRESSINGS) ×2 IMPLANT
ELECT REM PT RETURN 9FT ADLT (ELECTROSURGICAL)
ELECTRODE REM PT RTRN 9FT ADLT (ELECTROSURGICAL) IMPLANT
GAUZE 4X4 16PLY ~~LOC~~+RFID DBL (SPONGE) ×2 IMPLANT
GLOVE SURG ENC MOIS LTX SZ6 (GLOVE) ×2 IMPLANT
GLOVE SURG LTX SZ6.5 (GLOVE) ×2 IMPLANT
GLOVE SURG UNDER POLY LF SZ6 (GLOVE) ×2 IMPLANT
GLOVE SURG UNDER POLY LF SZ7 (GLOVE) ×4 IMPLANT
GOWN STRL REUS W/TWL LRG LVL3 (GOWN DISPOSABLE) ×6 IMPLANT
IV NS IRRIG 3000ML ARTHROMATIC (IV SOLUTION) ×2 IMPLANT
KIT PROCEDURE FLUENT (KITS) ×2 IMPLANT
KIT TURNOVER CYSTO (KITS) ×2 IMPLANT
PACK VAGINAL MINOR WOMEN LF (CUSTOM PROCEDURE TRAY) ×2 IMPLANT
PAD OB MATERNITY 4.3X12.25 (PERSONAL CARE ITEMS) ×2 IMPLANT
SEAL ROD LENS SCOPE MYOSURE (ABLATOR) ×2 IMPLANT
TOWEL OR 17X26 10 PK STRL BLUE (TOWEL DISPOSABLE) ×2 IMPLANT

## 2021-03-06 NOTE — Anesthesia Postprocedure Evaluation (Signed)
Anesthesia Post Note  Patient: Natalie Hopkins  Procedure(s) Performed: HYSTEROSCOPY; DILATATION AND CURETTAGE WIITH NOVASURE ABLATION (Vagina )     Patient location during evaluation: PACU Anesthesia Type: General Level of consciousness: awake and alert Pain management: pain level controlled Vital Signs Assessment: post-procedure vital signs reviewed and stable Respiratory status: spontaneous breathing, nonlabored ventilation, respiratory function stable and patient connected to nasal cannula oxygen Cardiovascular status: blood pressure returned to baseline and stable Postop Assessment: no apparent nausea or vomiting Anesthetic complications: no   No notable events documented.  Last Vitals:  Vitals:   03/06/21 1230 03/06/21 1315  BP: 139/77 134/82  Pulse: 65 72  Resp: 11 14  Temp:  36.5 C  SpO2: 96% 98%    Last Pain:  Vitals:   03/06/21 1315  TempSrc: Oral  PainSc: 0-No pain                 Trevor Iha

## 2021-03-06 NOTE — Anesthesia Procedure Notes (Signed)
Procedure Name: LMA Insertion Date/Time: 03/06/2021 11:16 AM Performed by: Bishop Limbo, CRNA Pre-anesthesia Checklist: Patient identified, Emergency Drugs available, Suction available and Patient being monitored Patient Re-evaluated:Patient Re-evaluated prior to induction Oxygen Delivery Method: Circle System Utilized Preoxygenation: Pre-oxygenation with 100% oxygen Induction Type: IV induction Ventilation: Mask ventilation without difficulty LMA: LMA inserted LMA Size: 4.0 Number of attempts: 1 Airway Equipment and Method: Bite block Placement Confirmation: positive ETCO2 Tube secured with: Tape Dental Injury: Teeth and Oropharynx as per pre-operative assessment

## 2021-03-06 NOTE — Discharge Instructions (Signed)
  DO NOT TAKE MOTRIN/ADVIL/IBUPROFEN until after 5:45 today   DISCHARGE INSTRUCTIONS: HYSTEROSCOPY / ENDOMETRIAL ABLATION The following instructions have been prepared to help you care for yourself upon your return home.  May Remove Scop patch on or before  May take Ibuprofen after  May take stool softner while taking narcotic pain medication to prevent constipation.  Drink plenty of water.  Personal hygiene:  Use sanitary pads for vaginal drainage, not tampons.  Shower the day after your procedure.  NO tub baths, pools or Jacuzzis for 2-3 weeks.  Wipe front to back after using the bathroom.  Activity and limitations:  Do NOT drive or operate any equipment for 24 hours. The effects of anesthesia are still present and drowsiness may result.  Do NOT rest in bed all day.  Walking is encouraged.  Walk up and down stairs slowly.  You may resume your normal activity in one to two days or as indicated by your physician. Sexual activity: NO intercourse for at least 2 weeks after the procedure, or as indicated by your Doctor.  Diet: Eat a light meal as desired this evening. You may resume your usual diet tomorrow.  Return to Work: You may resume your work activities in one to two days or as indicated by Therapist, sports.  What to expect after your surgery: Expect to have vaginal bleeding/discharge for 2-3 days and spotting for up to 10 days. It is not unusual to have soreness for up to 1-2 weeks. You may have a slight burning sensation when you urinate for the first day. Mild cramps may continue for a couple of days. You may have a regular period in 2-6 weeks.  Call your doctor for any of the following:  Excessive vaginal bleeding or clotting, saturating and changing one pad every hour.  Inability to urinate 6 hours after discharge from hospital.  Pain not relieved by pain medication.  Fever of 100.4 F or greater.  Unusual vaginal discharge or odor.   Post Anesthesia Home Care  Instructions  Activity: Get plenty of rest for the remainder of the day. A responsible adult should stay with you for 24 hours following the procedure.  For the next 24 hours, DO NOT: -Drive a car -Advertising copywriter -Drink alcoholic beverages -Take any medication unless instructed by your physician -Make any legal decisions or sign important papers.  Meals: Start with liquid foods such as gelatin or soup. Progress to regular foods as tolerated. Avoid greasy, spicy, heavy foods. If nausea and/or vomiting occur, drink only clear liquids until the nausea and/or vomiting subsides. Call your physician if vomiting continues.  Special Instructions/Symptoms: Your throat may feel dry or sore from the anesthesia or the breathing tube placed in your throat during surgery. If this causes discomfort, gargle with warm salt water. The discomfort should disappear within 24 hours.  If you had a scopolamine patch placed behind your ear for the management of post- operative nausea and/or vomiting:  1. The medication in the patch is effective for 72 hours, after which it should be removed.  Wrap patch in a tissue and discard in the trash. Wash hands thoroughly with soap and water. 2. You may remove the patch earlier than 72 hours if you experience unpleasant side effects which may include dry mouth, dizziness or visual disturbances. 3. Avoid touching the patch. Wash your hands with soap and water after contact with the patch.

## 2021-03-06 NOTE — Op Note (Signed)
03/06/2021  12:10 PM  PATIENT:  Natalie Hopkins  45 y.o. female  PRE-OPERATIVE DIAGNOSIS:  Menorrhagia with irregular cycles, endometrial polyp noted with endometrial biopsy  POST-OPERATIVE DIAGNOSIS:  same 3rd degree rectocele  PROCEDURE:  Procedure(s): HYSTEROSCOPY; DILATATION AND CURETTAGE WIITH NOVASURE ABLATION  SURGEON:  Jerene Bears  ASSISTANTS: OR staff.    ANESTHESIA:   general  ESTIMATED BLOOD LOSS: 10 mL  BLOOD ADMINISTERED:none   FLUIDS: 700cc LR  UOP: 30 cc drained at end of procedure and was clear, pt voided before going back to OR  SPECIMEN:  endometrial curetting  DISPOSITION OF SPECIMEN:  PATHOLOGY  FINDINGS: normal endometrial cavity, no polyps present  DESCRIPTION OF OPERATION: Patient was taken to the operating room.  She is placed in the supine position. SCDs were on her lower extremities and functioning properly. General anesthesia with an LMA was administered without difficulty. Dr. Richardson Landry, anesthesia, oversaw case.  Legs were then placed in the [facility] stirrups in the low lithotomy position. The legs were lifted to the high lithotomy position and the Betadine prep was used on the inner thighs perineum and vagina x3. Patient was draped in a normal standard fashion. Pt voided before going back to the OR so catheterization was not done at the beginning of the procedure.  Pt does have a significant rectocele noted under anesthesia in the oeprating room.  This was more significant then when pt is in the office.  A bivalve speculum was placed the vagina. The anterior lip of the cervix was grasped with single-tooth tenaculum.  A paracervical block of 1% lidocaine mixed one-to-one with epinephrine (1:100,000 units).  10 cc was used total. The cervix is dilated up to #8 Hagar dilators. Cervical stenosis was present so 1/2 hagar dilators used to prevent tearing from the anterior lip of the cervix.  The endometrial cavity sounded to 7 cm.   A 2.9 millimeter diagnostic  hysteroscope was obtained. Normal saline was used as a hysteroscopic fluid. The hysteroscope was advanced through the endocervical canal into the endometrial cavity. The tubal ostia were noted bilaterally. Additional findings included no polyps but fluffy appearing endometrium.  The hysteroscope was removed. A #1 toothed curette was used to curette the cavity until rough gritty texture was noted in all quadrants. At this point, the novasure device was obtained.  The cavity length was measured at 4 cm.  The device was passed through the cervix and into the endometrial cavity.  The array was opened and the device seated.  The width was 2.6.  Power settign was 57W.  The cavity assessment test was performed and passed.  Then the ablation cycle was initiated.  This lasted 1:13 seconds.  The array was closed.  Charred tissue was noted on the array.  The hysteroscope was used to visualize the cavity.  Blanched and charred tissue noted consistent with good ablation cycle.  At this point no other procedure was needed and this procedure was ended.  Hysteroscope was removed.    An I&I cath was performed to ensure urine was clear as tenaculum was needed to be high on the cervix due to the cervical stenosis.  Urine was clear.   The fluid deficit was 50 cc NS. The tenaculum was removed from the anterior lip of the cervix. The speculum was removed from the vagina. The prep was cleansed of the patient's skin. The legs are positioned back in the supine position. Sponge, lap, needle, initially counts were correct x2. Patient was taken to recovery  in stable condition.   COUNTS:  YES  PLAN OF CARE: Transfer to PACU

## 2021-03-06 NOTE — H&P (Signed)
Natalie Hopkins is an 45 y.o. female G64P3 MWF here for hysteroscopy with possible polyp resection, endometrial ablation due to menorrhagia with irregular cycles.  Pt has been progesterone therapy and she had side effects so has stopped this.  She does not want to be on any other hormonal therapy and declined IUD use.  She is here for treatment with endometrial ablations.  Risks, benefits and alteratives have been reviewed. Questions answered today.  Pertinent Gynecological History: Menses:  heavy and irregular Contraception:  BTL DES exposure: denies Blood transfusions: none Sexually transmitted diseases: no past history Previous GYN Procedures: laparoscopy for ectopic  with tubal ligation later Last mammogram: normal Date: 09/2020 Last pap: normal Date: 10/2018 OB History: G5, P3   Menstrual History: Patient's last menstrual period was 01/21/2021.    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 She has never used smokeless tobacco. She reports current alcohol use. She reports that she does not use  drugs.  Allergies: No Known Allergies  Medications Prior to Admission  Medication Sig Dispense Refill Last Dose   buPROPion (WELLBUTRIN XL) 300 MG 24 hr tablet Take 300 mg by mouth daily.   03/05/2021   Cholecalciferol (VITAMIN D3) 30 MCG/15ML LIQD Take by mouth daily.   03/05/2021   fexofenadine (ALLEGRA ALLERGY) 180 MG tablet Take 1 tablet (180 mg total) by mouth daily. (Patient taking differently: Take 180 mg by mouth at bedtime.) 90 tablet 3 Past Week   FLUoxetine (PROZAC) 10 MG capsule Take 1 capsule (10 mg total) by mouth daily. 90 capsule 3 Past Week   fluticasone (FLONASE) 50 MCG/ACT nasal spray Place 2 sprays into both nostrils at bedtime.   Past Week   lisdexamfetamine (VYVANSE) 40 MG capsule Take 1 capsule (40 mg total) by mouth every morning. 30 capsule 0 03/05/2021   meloxicam (MOBIC) 7.5 MG tablet Take 7.5 mg by mouth daily.   03/05/2021   omeprazole (PRILOSEC) 20 MG capsule Take 20 mg by mouth daily.   Past Week   traZODone (DESYREL) 100 MG tablet Take 100 mg by mouth at bedtime.   Past Month   vitamin B-12 (CYANOCOBALAMIN) 100 MCG tablet Take 50 mcg by mouth daily.   03/05/2021    Review of Systems  Blood pressure (!) 153/93, temperature 98 F (36.7 C),  temperature source Oral, resp. rate 18, height 5\' 3"  (1.6 m), weight 106.1 kg, last menstrual period 01/21/2021, SpO2 97 %. Physical Exam Constitutional:      Appearance: Normal appearance.  Cardiovascular:     Rate and Rhythm: Normal rate and regular rhythm.     Pulses: Normal pulses.     Heart sounds: Normal heart sounds.  Skin:    General: Skin is warm.  Neurological:     General: No focal deficit present.     Mental Status: She is alert.  Psychiatric:        Mood and Affect: Mood normal.    Results for orders placed or performed during the hospital encounter of 03/06/21 (from the past 24 hour(s))  Pregnancy, urine POC     Status: None   Collection Time: 03/06/21  7:50 AM  Result Value Ref Range   Preg  Test, Ur NEGATIVE NEGATIVE  ABO/Rh     Status: None   Collection Time: 03/06/21  8:04 AM  Result Value Ref Range   ABO/RH(D)      O POS Performed at [facility], [address] [place], [place]     No results found.  Assessment/Plan: 45 yo G5 P3 MWF with menorrhagia and irregular cycles.  Has undergone ultrasound and endometrial biopsy and failed progesterone therapy.  She is here for hysteroscopy, polyp removal and endometrial ablation.  Questions answered.  Pt ready to proceed.  59 03/06/2021, 10:52 AM

## 2021-03-06 NOTE — Transfer of Care (Signed)
Immediate Anesthesia Transfer of Care Note  Patient: Natalie Hopkins  Procedure(s) Performed: HYSTEROSCOPY; DILATATION AND CURETTAGE WIITH NOVASURE ABLATION (Vagina )  Patient Location: PACU  Anesthesia Type:General  Level of Consciousness: drowsy and patient cooperative  Airway & Oxygen Therapy: Patient Spontanous Breathing and Patient connected to face mask oxygen  Post-op Assessment: Report given to RN and Post -op Vital signs reviewed and stable  Post vital signs: Reviewed and stable  Last Vitals:  Vitals Value Taken Time  BP 142/93 03/06/21 1203  Temp 36.4 C 03/06/21 1203  Pulse 81 03/06/21 1208  Resp 12 03/06/21 1208  SpO2 98 % 03/06/21 1208  Vitals shown include unvalidated device data.  Last Pain:  Vitals:   03/06/21 1203  TempSrc:   PainSc: 0-No pain      Patients Stated Pain Goal: 5 (03/06/21 0807)  Complications: No notable events documented.

## 2021-03-07 ENCOUNTER — Encounter (HOSPITAL_BASED_OUTPATIENT_CLINIC_OR_DEPARTMENT_OTHER): Payer: Self-pay | Admitting: Obstetrics & Gynecology

## 2021-03-07 LAB — SURGICAL PATHOLOGY

## 2021-03-19 ENCOUNTER — Ambulatory Visit: Payer: No Typology Code available for payment source | Admitting: Family Medicine

## 2021-03-19 ENCOUNTER — Other Ambulatory Visit: Payer: Self-pay

## 2021-03-19 ENCOUNTER — Encounter: Payer: Self-pay | Admitting: Family Medicine

## 2021-03-19 DIAGNOSIS — H6993 Unspecified Eustachian tube disorder, bilateral: Secondary | ICD-10-CM

## 2021-03-19 DIAGNOSIS — I10 Essential (primary) hypertension: Secondary | ICD-10-CM

## 2021-03-19 DIAGNOSIS — F988 Other specified behavioral and emotional disorders with onset usually occurring in childhood and adolescence: Secondary | ICD-10-CM

## 2021-03-19 DIAGNOSIS — H6983 Other specified disorders of Eustachian tube, bilateral: Secondary | ICD-10-CM

## 2021-03-19 DIAGNOSIS — F32A Depression, unspecified: Secondary | ICD-10-CM

## 2021-03-19 DIAGNOSIS — H6980 Other specified disorders of Eustachian tube, unspecified ear: Secondary | ICD-10-CM | POA: Insufficient documentation

## 2021-03-19 DIAGNOSIS — F908 Attention-deficit hyperactivity disorder, other type: Secondary | ICD-10-CM

## 2021-03-19 MED ORDER — LISDEXAMFETAMINE DIMESYLATE 50 MG PO CAPS: 50.0000 mg | ORAL_CAPSULE | ORAL | 0 refills | Status: DC

## 2021-03-19 MED ORDER — FLUOXETINE HCL 20 MG PO CAPS: 20.0000 mg | ORAL_CAPSULE | Freq: Every day | ORAL | 1 refills | Status: DC

## 2021-03-19 MED ORDER — METOPROLOL SUCCINATE ER 50 MG PO TB24: 50.0000 mg | ORAL_TABLET | Freq: Every day | ORAL | 3 refills | Status: DC

## 2021-03-19 MED ORDER — PREDNISONE 20 MG PO TABS: 20.0000 mg | ORAL_TABLET | Freq: Every day | ORAL | 0 refills | Status: AC

## 2021-03-19 MED ORDER — BUPROPION HCL ER (XL) 150 MG PO TB24: ORAL_TABLET | ORAL | 1 refills | Status: DC

## 2021-03-19 NOTE — Progress Notes (Signed)
Natalie Hopkins - 45 y.o. female MRN [id number]  Date of birth: [birth year]  Subjective No chief complaint on file.   HPI Natalie Hopkins is a 45 year old female here today for follow-up visit.  She is a former patient of Dr. Lyn Hopkins.  She has history of bipolar disorder with depression.  Is currently managed with combination of bupropion and fluoxetine.  Current listed dose of fluoxetine is incorrect.  She is currently taking 20 mg daily.  She is also out of Wellbutrin.  She has been off of this for a few weeks.  She is also taking Vyvanse at 40 mg daily.  She feels that this is not quite as effective as it has been previously.  She has not had any dose change recently.  Her blood pressure is elevated today.  She like to try metoprolol in the past.  She tolerated this well with good control of her blood pressure previously.  She has had issues with ear pressure eustachian tube dysfunction.  She has tried Flonase with only mild improvement.  ROS:  A comprehensive ROS was completed and negative except as noted per HPI   No Known Allergies  Past Medical History:  Diagnosis Date   ADHD (attention deficit hyperactivity disorder)    takes vyvanse   Anal fissure    Anemia    comes and go   Anxiety    Arthritis    Back pain    Depression    Diverticulosis    on CT   Gallstones    GERD (gastroesophageal reflux disease)    Hypertension    pt lost weight and went away   Migraine    Morbid obesity (HCC)    Multiple gastric ulcers    Pancreatitis    Pre-diabetes     Past Surgical History:  Procedure Laterality Date   BARIATRIC SURGERY  2014   Sleeve Gsatrectomy   CHOLECYSTECTOMY  2000   ECTOPIC PREGNANCY SURGERY     twice, with removal of 1 tube 1997 and 1998   HYSTEROSCOPY WITH NOVASURE N/A 03/06/2021   Procedure: HYSTEROSCOPY; DILATATION AND CURETTAGE WIITH NOVASURE ABLATION;  Surgeon: Jerene Bears, MD;  Location: [facility];  Service: Gynecology;  Laterality: N/A;    TUBAL LIGATION      Social History   Socioeconomic History   Marital status: Married    Spouse name: Not on file   Number of children: 3   Years of education: Not on file   Highest education level: Not on file  Occupational History   Occupation: insurance billing  Tobacco Use   Smoking status: Never   Smokeless tobacco: Never  Vaping Use   Vaping Use: Never used  Substance and Sexual Activity   Alcohol use: Yes    Comment: once a month   Drug use: No   Sexual activity: Yes    Partners: Male    Birth control/protection: Surgical  Other Topics Concern   Not on file  Social History Narrative   Not on file   Social Determinants of Health   Financial Resource Strain: Not on file  Food Insecurity: Not on file  Transportation Needs: Not on file  Physical Activity: Not on file  Stress: Not on file  Social Connections: Not on file    Family History  Problem Relation Age of Onset   Asthma Father    Bladder Cancer Mother    Irregular heart beat Mother    Breast cancer Maternal Grandmother    Diabetes  Paternal Grandfather    Ovarian cancer Maternal Aunt     Health Maintenance  Topic Date Due   Hepatitis C Screening  Never done   COVID-19 Vaccine (1) 04/04/2021 (Originally 12/22/1975)   PAP SMEAR-Modifier  11/01/2023   TETANUS/TDAP  09/15/2025   INFLUENZA VACCINE  Completed   HIV Screening  Completed   Pneumococcal Vaccine 20-42 Years old  Aged Out   HPV VACCINES  Aged Out     ----------------------------------------------------------------------------------------------------------------------------------------------------------------------------------------------------------------- Physical Exam BP (!) 151/83 (BP Location: Left Arm, Patient Position: Sitting, Cuff Size: Large)   Pulse 78   Temp 97.9 F (36.6 C)   Ht 5\' 3"  (1.6 m)   Wt 236 lb (107 kg)   SpO2 100%   BMI 41.81 kg/m   Physical Exam Constitutional:      Appearance: Normal appearance.   HENT:     Right Ear: Tympanic membrane normal.     Left Ear: Tympanic membrane normal.  Eyes:     General: No scleral icterus. Cardiovascular:     Rate and Rhythm: Normal rate and regular rhythm.  Pulmonary:     Effort: Pulmonary effort is normal.     Breath sounds: Normal breath sounds.  Musculoskeletal:     Cervical back: Neck supple.  Skin:    General: Skin is warm and dry.  Neurological:     General: No focal deficit present.     Mental Status: She is alert.  Psychiatric:        Mood and Affect: Mood normal.        Behavior: Behavior normal.    ------------------------------------------------------------------------------------------------------------------------------------------------------------------------------------------------------------------- Assessment and Plan  Hypertension Blood pressure elevated today.  Adding metoprolol 50 mg daily back on.  Low-sodium diet encouraged.  Follow-up for blood pressure recheck in about 1 month.  Attention deficit disorder Increasing Vyvanse to 50 mg daily.  Follow-up in 3 months.  Depressive disorder Fluoxetine dosing changed to 20 mg to reflect which she is currently taking.  Bupropion renewed.  We will have her start at 150 daily for the next 2 weeks then increase to 300 mg.  Eustachian tube dysfunction Adding course of prednisone 20 mg daily for the next 5 days.  We discussed referral to ENT if this is not helping.   Meds ordered this encounter  Medications   FLUoxetine (PROZAC) 20 MG capsule    Sig: Take 1 capsule (20 mg total) by mouth daily.    Dispense:  90 capsule    Refill:  1   buPROPion (WELLBUTRIN XL) 150 MG 24 hr tablet    Sig: Take 150mg  daily x2 weeks then increase to 300mg     Dispense:  180 tablet    Refill:  1   lisdexamfetamine (VYVANSE) 50 MG capsule    Sig: Take 1 capsule (50 mg total) by mouth every morning.    Dispense:  30 capsule    Refill:  0   predniSONE (DELTASONE) 20 MG tablet    Sig:  Take 1 tablet (20 mg total) by mouth daily with breakfast for 5 days.    Dispense:  5 tablet    Refill:  0   metoprolol succinate (TOPROL-XL) 50 MG 24 hr tablet    Sig: Take 1 tablet (50 mg total) by mouth daily. Take with or immediately following a meal.    Dispense:  90 tablet    Refill:  3    Return in about 3 months (around 06/18/2021) for HTN/ADD.    This visit occurred during the SARS-CoV-2  public health emergency.  Safety protocols were in place, including screening questions prior to the visit, additional usage of staff PPE, and extensive cleaning of exam room while observing appropriate contact time as indicated for disinfecting solutions.

## 2021-03-19 NOTE — Assessment & Plan Note (Signed)
Increasing Vyvanse to 50 mg daily.  Follow-up in 3 months.

## 2021-03-19 NOTE — Assessment & Plan Note (Signed)
Blood pressure elevated today.  Adding metoprolol 50 mg daily back on.  Low-sodium diet encouraged.  Follow-up for blood pressure recheck in about 1 month.

## 2021-03-19 NOTE — Assessment & Plan Note (Signed)
Adding course of prednisone 20 mg daily for the next 5 days.  We discussed referral to ENT if this is not helping.

## 2021-03-19 NOTE — Assessment & Plan Note (Signed)
Fluoxetine dosing changed to 20 mg to reflect which she is currently taking.  Bupropion renewed.  We will have her start at 150 daily for the next 2 weeks then increase to 300 mg.

## 2021-05-04 ENCOUNTER — Other Ambulatory Visit: Payer: Self-pay | Admitting: Family Medicine

## 2021-05-04 DIAGNOSIS — F908 Attention-deficit hyperactivity disorder, other type: Secondary | ICD-10-CM

## 2021-05-06 MED ORDER — LISDEXAMFETAMINE DIMESYLATE 50 MG PO CAPS: 50.0000 mg | ORAL_CAPSULE | ORAL | 0 refills | Status: DC

## 2021-05-06 NOTE — Telephone Encounter (Addendum)
Done

## 2021-05-27 ENCOUNTER — Encounter (HOSPITAL_BASED_OUTPATIENT_CLINIC_OR_DEPARTMENT_OTHER): Payer: Self-pay | Admitting: *Deleted

## 2021-06-05 ENCOUNTER — Other Ambulatory Visit: Payer: Self-pay | Admitting: Sports Medicine

## 2021-06-05 DIAGNOSIS — F908 Attention-deficit hyperactivity disorder, other type: Secondary | ICD-10-CM

## 2021-06-10 ENCOUNTER — Encounter: Payer: Self-pay | Admitting: Family Medicine

## 2021-06-10 DIAGNOSIS — F908 Attention-deficit hyperactivity disorder, other type: Secondary | ICD-10-CM

## 2021-06-10 MED ORDER — LISDEXAMFETAMINE DIMESYLATE 50 MG PO CAPS: 50.0000 mg | ORAL_CAPSULE | ORAL | 0 refills | Status: DC

## 2021-06-18 ENCOUNTER — Other Ambulatory Visit: Payer: Self-pay

## 2021-06-18 ENCOUNTER — Encounter: Payer: Self-pay | Admitting: Family Medicine

## 2021-06-18 ENCOUNTER — Ambulatory Visit: Payer: No Typology Code available for payment source | Admitting: Family Medicine

## 2021-06-18 DIAGNOSIS — F331 Major depressive disorder, recurrent, moderate: Secondary | ICD-10-CM | POA: Diagnosis not present

## 2021-06-18 DIAGNOSIS — F908 Attention-deficit hyperactivity disorder, other type: Secondary | ICD-10-CM | POA: Diagnosis not present

## 2021-06-18 DIAGNOSIS — I10 Essential (primary) hypertension: Secondary | ICD-10-CM | POA: Diagnosis not present

## 2021-06-18 DIAGNOSIS — F900 Attention-deficit hyperactivity disorder, predominantly inattentive type: Secondary | ICD-10-CM | POA: Diagnosis not present

## 2021-06-18 MED ORDER — LISDEXAMFETAMINE DIMESYLATE 50 MG PO CAPS: 50.0000 mg | ORAL_CAPSULE | ORAL | 0 refills | Status: DC

## 2021-06-18 MED ORDER — FLUOXETINE HCL 20 MG PO CAPS: 20.0000 mg | ORAL_CAPSULE | Freq: Every day | ORAL | 1 refills | Status: DC

## 2021-06-18 MED ORDER — BUPROPION HCL ER (XL) 150 MG PO TB24: ORAL_TABLET | ORAL | 1 refills | Status: DC

## 2021-06-18 NOTE — Assessment & Plan Note (Signed)
Blood pressure is elevated today.  Discussed recommendations to take metoprolol daily.  Return in 1 month to recheck blood pressure on medication.  Low-sodium diet encouraged.

## 2021-06-18 NOTE — Assessment & Plan Note (Signed)
She continues to do well with Vyvanse at current strength.  Recommend continuation at 50 mg daily.  Prescription renewed.

## 2021-06-18 NOTE — Patient Instructions (Signed)
Be sure to take metoprolol daily.  Follow up in 1 month for nurse visit to recheck BP. See me again in 6 months.

## 2021-06-18 NOTE — Progress Notes (Signed)
Natalie Hopkins - 46 y.o. female MRN [id number]  Date of birth: [birth year]  Subjective Chief Complaint  Patient presents with   Follow-up    ADD   Hypertension    HPI Natalie Hopkins is a 46 year old female here today for follow-up visit.  Reports she is doing well at this time.  She continues to do well with Vyvanse for management of ADHD.  She denies side effects related to medication including increased anxiety, palpitations or insomnia.  Her depression anxiety remains well controlled with combination of fluoxetine and bupropion.  Tolerating well.  Blood pressure is elevated today.  She reports she is not taking metoprolol regularly.  She does not monitor blood pressure at home.  She denies chest pain, shortness of breath, palpitations, headaches or vision changes.  ROS:  A comprehensive ROS was completed and negative except as noted per HPI  No Known Allergies  Past Medical History:  Diagnosis Date   ADHD (attention deficit hyperactivity disorder)    takes vyvanse   Anal fissure    Anemia    comes and go   Anxiety    Arthritis    Back pain    Depression    Diverticulosis    on CT   Gallstones    GERD (gastroesophageal reflux disease)    Hypertension    pt lost weight and went away   Migraine    Morbid obesity (HCC)    Multiple gastric ulcers    Pancreatitis    Pre-diabetes     Past Surgical History:  Procedure Laterality Date   BARIATRIC SURGERY  2014   Sleeve Gsatrectomy   CHOLECYSTECTOMY  2000   ECTOPIC PREGNANCY SURGERY     twice, with removal of 1 tube 1997 and 1998   HYSTEROSCOPY WITH NOVASURE N/A 03/06/2021   Procedure: HYSTEROSCOPY; DILATATION AND CURETTAGE WIITH NOVASURE ABLATION;  Surgeon: Jerene Bears, MD;  Location: [facility] [place];  Service: Gynecology;  Laterality: N/A;   TUBAL LIGATION      Social History   Socioeconomic History   Marital status: Married    Spouse name: Not on file   Number of children: 3   Years of education: Not  on file   Highest education level: Not on file  Occupational History   Occupation: insurance billing  Tobacco Use   Smoking status: Never   Smokeless tobacco: Never  Vaping Use   Vaping Use: Never used  Substance and Sexual Activity   Alcohol use: Yes    Comment: once a month   Drug use: No   Sexual activity: Yes    Partners: Male    Birth control/protection: Surgical  Other Topics Concern   Not on file  Social History Narrative   Not on file   Social Determinants of Health   Financial Resource Strain: Not on file  Food Insecurity: Not on file  Transportation Needs: Not on file  Physical Activity: Not on file  Stress: Not on file  Social Connections: Not on file    Family History  Problem Relation Age of Onset   Asthma Father    Bladder Cancer Mother    Irregular heart beat Mother    Breast cancer Maternal Grandmother    Diabetes Paternal Grandfather    Ovarian cancer Maternal Aunt     Health Maintenance  Topic Date Due   Hepatitis C Screening  Never done   COVID-19 Vaccine (3 - Booster for Pfizer series) 04/10/2020   PAP SMEAR-Modifier  11/01/2023  TETANUS/TDAP  09/15/2025   INFLUENZA VACCINE  Completed   HIV Screening  Completed   HPV VACCINES  Aged Out     ----------------------------------------------------------------------------------------------------------------------------------------------------------------------------------------------------------------- Physical Exam BP (!) 138/96    Pulse 84    Ht 5\' 3"  (1.6 m)    Wt 238 lb (108 kg)    SpO2 98%    BMI 42.16 kg/m   Physical Exam Constitutional:      Appearance: Normal appearance.  Eyes:     General: No scleral icterus. Cardiovascular:     Rate and Rhythm: Normal rate and regular rhythm.  Pulmonary:     Effort: Pulmonary effort is normal.     Breath sounds: Normal breath sounds.  Musculoskeletal:     Cervical back: Neck supple.  Neurological:     Mental Status: She is alert.   Psychiatric:        Mood and Affect: Mood normal.        Behavior: Behavior normal.    ------------------------------------------------------------------------------------------------------------------------------------------------------------------------------------------------------------------- Assessment and Plan  Hypertension Blood pressure is elevated today.  Discussed recommendations to take metoprolol daily.  Return in 1 month to recheck blood pressure on medication.  Low-sodium diet encouraged.  Attention deficit hyperactivity disorder (ADHD), inattentive type, moderate She continues to do well with Vyvanse at current strength.  Recommend continuation at 50 mg daily.  Prescription renewed.  MDD (major depressive disorder), recurrent episode, moderate (HCC) Stable combination of fluoxetine and bupropion.  We will continue at current strength.   Meds ordered this encounter  Medications   lisdexamfetamine (VYVANSE) 50 MG capsule    Sig: Take 1 capsule (50 mg total) by mouth every morning.    Dispense:  90 capsule    Refill:  0   FLUoxetine (PROZAC) 20 MG capsule    Sig: Take 1 capsule (20 mg total) by mouth daily.    Dispense:  90 capsule    Refill:  1   buPROPion (WELLBUTRIN XL) 150 MG 24 hr tablet    Sig: Take 150mg  daily x2 weeks then increase to 300mg     Dispense:  180 tablet    Refill:  1    Return in about 6 months (around 12/16/2021) for CPE and FBW.    This visit occurred during the SARS-CoV-2 public health emergency.  Safety protocols were in place, including screening questions prior to the visit, additional usage of staff PPE, and extensive cleaning of exam room while observing appropriate contact time as indicated for disinfecting solutions.

## 2021-06-18 NOTE — Assessment & Plan Note (Signed)
Stable combination of fluoxetine and bupropion.  We will continue at current strength.

## 2021-07-11 ENCOUNTER — Other Ambulatory Visit: Payer: Self-pay

## 2021-07-11 NOTE — Progress Notes (Signed)
#  90 with 1 refill sent in February.

## 2021-07-16 ENCOUNTER — Ambulatory Visit: Payer: No Typology Code available for payment source

## 2021-08-21 ENCOUNTER — Other Ambulatory Visit: Payer: Self-pay | Admitting: Orthopaedic Surgery

## 2021-08-27 ENCOUNTER — Encounter (INDEPENDENT_AMBULATORY_CARE_PROVIDER_SITE_OTHER): Payer: No Typology Code available for payment source | Admitting: Family Medicine

## 2021-08-27 DIAGNOSIS — T753XXA Motion sickness, initial encounter: Secondary | ICD-10-CM | POA: Diagnosis not present

## 2021-08-28 MED ORDER — SCOPOLAMINE 1 MG/3DAYS TD PT72: 1.0000 | MEDICATED_PATCH | TRANSDERMAL | 0 refills | Status: DC

## 2021-08-28 NOTE — Addendum Note (Signed)
Addended by: Perlie Mayo on: 08/28/2021 10:33 AM ? ? Modules accepted: Orders ? ?

## 2021-08-28 NOTE — Telephone Encounter (Signed)
Please see the MyChart message reply(ies) for my assessment and plan.    This patient gave consent for this Medical Advice Message and is aware that it may result in a bill to their insurance company, as well as the possibility of receiving a bill for a co-payment or deductible. They are an established patient, but are not seeking medical advice exclusively about a problem treated during an in person or video visit in the last seven days. I did not recommend an in person or video visit within seven days of my reply.    I spent a total of 8 minutes cumulative time within 7 days through MyChart messaging.  Dreyden Rohrman, DO   

## 2021-11-08 ENCOUNTER — Other Ambulatory Visit: Payer: Self-pay | Admitting: Family Medicine

## 2021-11-08 DIAGNOSIS — F908 Attention-deficit hyperactivity disorder, other type: Secondary | ICD-10-CM

## 2021-11-08 MED ORDER — LISDEXAMFETAMINE DIMESYLATE 50 MG PO CAPS: 50.0000 mg | ORAL_CAPSULE | ORAL | 0 refills | Status: DC

## 2021-11-11 ENCOUNTER — Encounter: Payer: Self-pay | Admitting: Family Medicine

## 2021-11-11 ENCOUNTER — Other Ambulatory Visit (HOSPITAL_COMMUNITY): Payer: Self-pay

## 2021-11-11 DIAGNOSIS — F908 Attention-deficit hyperactivity disorder, other type: Secondary | ICD-10-CM

## 2021-11-11 MED ORDER — LISDEXAMFETAMINE DIMESYLATE 50 MG PO CAPS
50.0000 mg | ORAL_CAPSULE | ORAL | 0 refills | Status: DC
  Filled 2021-11-11: qty 90, 90d supply, fill #0

## 2021-11-13 ENCOUNTER — Other Ambulatory Visit (HOSPITAL_COMMUNITY): Payer: Self-pay

## 2021-12-16 ENCOUNTER — Encounter: Payer: Self-pay | Admitting: Family Medicine

## 2021-12-16 ENCOUNTER — Ambulatory Visit (INDEPENDENT_AMBULATORY_CARE_PROVIDER_SITE_OTHER): Payer: Managed Care, Other (non HMO) | Admitting: Family Medicine

## 2021-12-16 VITALS — BP 149/87 | HR 72 | Resp 20 | Ht 63.0 in | Wt 229.0 lb

## 2021-12-16 DIAGNOSIS — Z13220 Encounter for screening for lipoid disorders: Secondary | ICD-10-CM | POA: Diagnosis not present

## 2021-12-16 DIAGNOSIS — I10 Essential (primary) hypertension: Secondary | ICD-10-CM

## 2021-12-16 DIAGNOSIS — Z23 Encounter for immunization: Secondary | ICD-10-CM | POA: Diagnosis not present

## 2021-12-16 DIAGNOSIS — Z0000 Encounter for general adult medical examination without abnormal findings: Secondary | ICD-10-CM | POA: Insufficient documentation

## 2021-12-16 DIAGNOSIS — Z1211 Encounter for screening for malignant neoplasm of colon: Secondary | ICD-10-CM

## 2021-12-16 DIAGNOSIS — Z9884 Bariatric surgery status: Secondary | ICD-10-CM | POA: Diagnosis not present

## 2021-12-16 NOTE — Progress Notes (Signed)
Natalie Hopkins - 46 y.o. female MRN [id number]  Date of birth: 08-12-1975  Subjective Chief Complaint  Patient presents with   Annual Exam    HPI Natalie Hopkins is a 46 y.o. female here today for annual exam.  She reports that is doing well at this time.   Continues to do well with current medications.  Her BP is elevated today.  Denies symptoms related to this.    She tries to stay pretty active.  Feels like diet is pretty good.   She is a non-smoker.  Rarely consumes EtOH  Due for flu vaccine, would like to have this today.   Due for colon cancer screening, requesting referral for colonoscopy.   Review of Systems  Constitutional:  Negative for chills, fever, malaise/fatigue and weight loss.  HENT:  Negative for congestion, ear pain and sore throat.   Eyes:  Negative for blurred vision, double vision and pain.  Respiratory:  Negative for cough and shortness of breath.   Cardiovascular:  Negative for chest pain and palpitations.  Gastrointestinal:  Negative for abdominal pain, blood in stool, constipation, heartburn and nausea.  Genitourinary:  Negative for dysuria and urgency.  Musculoskeletal:  Negative for joint pain and myalgias.  Neurological:  Negative for dizziness and headaches.  Endo/Heme/Allergies:  Does not bruise/bleed easily.  Psychiatric/Behavioral:  Negative for depression. The patient is not nervous/anxious and does not have insomnia.     No Known Allergies  Past Medical History:  Diagnosis Date   ADHD (attention deficit hyperactivity disorder)    takes vyvanse   Anal fissure    Anemia    comes and go   Anxiety    Arthritis    Back pain    Depression    Diverticulosis    on CT   Gallstones    GERD (gastroesophageal reflux disease)    Hypertension    pt lost weight and went away   Migraine    Morbid obesity (HCC)    Multiple gastric ulcers    Pancreatitis    Pre-diabetes     Past Surgical History:  Procedure Laterality Date   BARIATRIC  SURGERY  2014   Sleeve Gsatrectomy   CHOLECYSTECTOMY  2000   ECTOPIC PREGNANCY SURGERY     twice, with removal of 1 tube 1997 and 1998   HYSTEROSCOPY WITH NOVASURE N/A 03/06/2021   Procedure: HYSTEROSCOPY; DILATATION AND CURETTAGE WIITH NOVASURE ABLATION;  Surgeon: Jerene Bears, MD;  Location: [facility] [place];  Service: Gynecology;  Laterality: N/A;   TUBAL LIGATION      Social History   Socioeconomic History   Marital status: Married    Spouse name: Not on file   Number of children: 3   Years of education: Not on file   Highest education level: Not on file  Occupational History   Occupation: insurance billing  Tobacco Use   Smoking status: Never   Smokeless tobacco: Never  Vaping Use   Vaping Use: Never used  Substance and Sexual Activity   Alcohol use: Yes    Comment: once a month   Drug use: No   Sexual activity: Yes    Partners: Male    Birth control/protection: Surgical  Other Topics Concern   Not on file  Social History Narrative   Not on file   Social Determinants of Health   Financial Resource Strain: Not on file  Food Insecurity: Not on file  Transportation Needs: Not on file  Physical Activity: Not on file  Stress: Not on file  Social Connections: Not on file    Family History  Problem Relation Age of Onset   Asthma Father    Bladder Cancer Mother    Irregular heart beat Mother    Breast cancer Maternal Grandmother    Diabetes Paternal Grandfather    Ovarian cancer Maternal Aunt     Health Maintenance  Topic Date Due   Hepatitis C Screening  Never done   COLONOSCOPY (Pts 45-27yrs Insurance coverage will need to be confirmed)  Never done   INFLUENZA VACCINE  11/19/2021   COVID-19 Vaccine (3 - Pfizer series) 02/15/2022 (Originally 04/10/2020)   PAP SMEAR-Modifier  11/01/2023   TETANUS/TDAP  09/15/2025   HIV Screening  Completed   HPV VACCINES  Aged Out      ----------------------------------------------------------------------------------------------------------------------------------------------------------------------------------------------------------------- Physical Exam BP (!) 159/84 (BP Location: Left Arm, Cuff Size: Normal)   Pulse 81   Resp 20   Ht 5\' 3"  (1.6 m)   Wt 229 lb 0.6 oz (103.9 kg)   SpO2 96%   BMI 40.57 kg/m   Physical Exam Constitutional:      General: She is not in acute distress. HENT:     Head: Normocephalic and atraumatic.     Right Ear: Tympanic membrane and ear canal normal.     Left Ear: Tympanic membrane and ear canal normal.     Nose: Nose normal.  Eyes:     General: No scleral icterus.    Conjunctiva/sclera: Conjunctivae normal.  Neck:     Thyroid: No thyromegaly.  Cardiovascular:     Rate and Rhythm: Normal rate and regular rhythm.     Heart sounds: Normal heart sounds.  Pulmonary:     Effort: Pulmonary effort is normal.     Breath sounds: Normal breath sounds.  Abdominal:     General: Bowel sounds are normal. There is no distension.     Palpations: Abdomen is soft.     Tenderness: There is no abdominal tenderness. There is no guarding.  Musculoskeletal:        General: Normal range of motion.     Cervical back: Normal range of motion and neck supple.  Lymphadenopathy:     Cervical: No cervical adenopathy.  Skin:    General: Skin is warm and dry.     Findings: No rash.  Neurological:     General: No focal deficit present.     Mental Status: She is alert and oriented to person, place, and time.     Cranial Nerves: No cranial nerve deficit.     Coordination: Coordination normal.  Psychiatric:        Mood and Affect: Mood normal.        Behavior: Behavior normal.      ------------------------------------------------------------------------------------------------------------------------------------------------------------------------------------------------------------------- Assessment and Plan  Well adult exam Well adult Orders Placed This Encounter  Procedures   COMPLETE METABOLIC PANEL WITH GFR   CBC with Differential   Lipid Panel w/reflex Direct LDL   TSH   Zinc   Vitamin D (25 hydroxy)   Iron, TIBC and Ferritin Panel   B12  Screenings: Per lab orders. Referral placed for colon cancer screening. Immunizations:  Flu vaccine.  Anticipatory guidance/Risk factor reduction:  Recommendations per AVS.    No orders of the defined types were placed in this encounter.   No follow-ups on file.    This visit occurred during the SARS-CoV-2 public health emergency.  Safety protocols were in place, including screening questions prior to the visit, additional  usage of staff PPE, and extensive cleaning of exam room while observing appropriate contact time as indicated for disinfecting solutions.   

## 2021-12-16 NOTE — Patient Instructions (Signed)
Preventive Care 45-45 Years Old, Female Preventive care refers to lifestyle choices and visits with your health care provider that can promote health and wellness. Preventive care visits are also called wellness exams. What can I expect for my preventive care visit? Counseling Your health care provider may ask you questions about your: Medical history, including: Past medical problems. Family medical history. Pregnancy history. Current health, including: Menstrual cycle. Method of birth control. Emotional well-being. Home life and relationship well-being. Sexual activity and sexual health. Lifestyle, including: Alcohol, nicotine or tobacco, and drug use. Access to firearms. Diet, exercise, and sleep habits. Work and work environment. Sunscreen use. Safety issues such as seatbelt and bike helmet use. Physical exam Your health care provider will check your: Height and weight. These may be used to calculate your BMI (body mass index). BMI is a measurement that tells if you are at a healthy weight. Waist circumference. This measures the distance around your waistline. This measurement also tells if you are at a healthy weight and may help predict your risk of certain diseases, such as type 2 diabetes and high blood pressure. Heart rate and blood pressure. Body temperature. Skin for abnormal spots. What immunizations do I need?  Vaccines are usually given at various ages, according to a schedule. Your health care provider will recommend vaccines for you based on your age, medical history, and lifestyle or other factors, such as travel or where you work. What tests do I need? Screening Your health care provider may recommend screening tests for certain conditions. This may include: Lipid and cholesterol levels. Diabetes screening. This is done by checking your blood sugar (glucose) after you have not eaten for a while (fasting). Pelvic exam and Pap test. Hepatitis B test. Hepatitis C  test. HIV (human immunodeficiency virus) test. STI (sexually transmitted infection) testing, if you are at risk. Lung cancer screening. Colorectal cancer screening. Mammogram. Talk with your health care provider about when you should start having regular mammograms. This may depend on whether you have a family history of breast cancer. BRCA-related cancer screening. This may be done if you have a family history of breast, ovarian, tubal, or peritoneal cancers. Bone density scan. This is done to screen for osteoporosis. Talk with your health care provider about your test results, treatment options, and if necessary, the need for more tests. Follow these instructions at home: Eating and drinking  Eat a diet that includes fresh fruits and vegetables, whole grains, lean protein, and low-fat dairy products. Take vitamin and mineral supplements as recommended by your health care provider. Do not drink alcohol if: Your health care provider tells you not to drink. You are pregnant, may be pregnant, or are planning to become pregnant. If you drink alcohol: Limit how much you have to 0-1 drink a day. Know how much alcohol is in your drink. In the [place] one drink equals one 12 oz bottle of beer (355 mL), one 5 oz glass of wine (148 mL), or one 1 oz glass of hard liquor (44 mL). Lifestyle Brush your teeth every morning and night with fluoride toothpaste. Floss one time each day. Exercise for at least 30 minutes 5 or more days each week. Do not use any products that contain nicotine or tobacco. These products include cigarettes, chewing tobacco, and vaping devices, such as e-cigarettes. If you need help quitting, ask your health care provider. Do not use drugs. If you are sexually active, practice safe sex. Use a condom or other form of protection to   prevent STIs. If you do not wish to become pregnant, use a form of birth control. If you plan to become pregnant, see your health care provider for a  prepregnancy visit. Take aspirin only as told by your health care provider. Make sure that you understand how much to take and what form to take. Work with your health care provider to find out whether it is safe and beneficial for you to take aspirin daily. Find healthy ways to manage stress, such as: Meditation, yoga, or listening to music. Journaling. Talking to a trusted person. Spending time with friends and family. Minimize exposure to UV radiation to reduce your risk of skin cancer. Safety Always wear your seat belt while driving or riding in a vehicle. Do not drive: If you have been drinking alcohol. Do not ride with someone who has been drinking. When you are tired or distracted. While texting. If you have been using any mind-altering substances or drugs. Wear a helmet and other protective equipment during sports activities. If you have firearms in your house, make sure you follow all gun safety procedures. Seek help if you have been physically or sexually abused. What's next? Visit your health care provider once a year for an annual wellness visit. Ask your health care provider how often you should have your eyes and teeth checked. Stay up to date on all vaccines. This information is not intended to replace advice given to you by your health care provider. Make sure you discuss any questions you have with your health care provider. Document Revised: 10/03/2020 Document Reviewed: 10/03/2020 Elsevier Patient Education  [place].

## 2021-12-16 NOTE — Assessment & Plan Note (Signed)
Well adult Orders Placed This Encounter  Procedures  . COMPLETE METABOLIC PANEL WITH GFR  . CBC with Differential  . Lipid Panel w/reflex Direct LDL  . TSH  . Zinc  . Vitamin D (25 hydroxy)  . Iron, TIBC and Ferritin Panel  . B12  Screenings: Per lab orders. Referral placed for colon cancer screening. Immunizations:  Flu vaccine.  Anticipatory guidance/Risk factor reduction:  Recommendations per AVS.

## 2021-12-18 LAB — COMPLETE METABOLIC PANEL WITH GFR
AG Ratio: 1.5 (calc) (ref 1.0–2.5)
ALT: 19 U/L (ref 6–29)
AST: 19 U/L (ref 10–35)
Albumin: 4.3 g/dL (ref 3.6–5.1)
Alkaline phosphatase (APISO): 75 U/L (ref 31–125)
BUN: 24 mg/dL (ref 7–25)
CO2: 25 mmol/L (ref 20–32)
Calcium: 9.6 mg/dL (ref 8.6–10.2)
Chloride: 104 mmol/L (ref 98–110)
Creat: 0.83 mg/dL (ref 0.50–0.99)
Globulin: 2.9 g/dL (calc) (ref 1.9–3.7)
Glucose, Bld: 102 mg/dL — ABNORMAL HIGH (ref 65–99)
Potassium: 4.2 mmol/L (ref 3.5–5.3)
Sodium: 139 mmol/L (ref 135–146)
Total Bilirubin: 0.7 mg/dL (ref 0.2–1.2)
Total Protein: 7.2 g/dL (ref 6.1–8.1)
eGFR: 89 mL/min/{1.73_m2} (ref >=60)

## 2021-12-18 LAB — CBC WITH DIFFERENTIAL/PLATELET
Absolute Monocytes: 422 cells/uL (ref 200–950)
Basophils Absolute: 29 cells/uL (ref 0–200)
Basophils Relative: 0.5 %
Eosinophils Absolute: 63 cells/uL (ref 15–500)
Eosinophils Relative: 1.1 %
HCT: 43.3 % (ref 35.0–45.0)
Hemoglobin: 14.8 g/dL (ref 11.7–15.5)
Lymphs Abs: 1368 cells/uL (ref 850–3900)
MCH: 29.6 pg (ref 27.0–33.0)
MCHC: 34.2 g/dL (ref 32.0–36.0)
MCV: 86.6 fL (ref 80.0–100.0)
MPV: 9.3 fL (ref 7.5–12.5)
Monocytes Relative: 7.4 %
Neutro Abs: 3819 cells/uL (ref 1500–7800)
Neutrophils Relative %: 67 %
Platelets: 262 10*3/uL (ref 140–400)
RBC: 5 10*6/uL (ref 3.80–5.10)
RDW: 12.7 % (ref 11.0–15.0)
Total Lymphocyte: 24 %
WBC: 5.7 10*3/uL (ref 3.8–10.8)

## 2021-12-18 LAB — IRON,TIBC AND FERRITIN PANEL
%SAT: 24 % (calc) (ref 16–45)
Ferritin: 39 ng/mL (ref 16–232)
Iron: 91 ug/dL (ref 40–190)
TIBC: 387 mcg/dL (calc) (ref 250–450)

## 2021-12-18 LAB — ZINC: Zinc: 73 ug/dL (ref 60–130)

## 2021-12-18 LAB — TSH: TSH: 1.87 mIU/L

## 2021-12-18 LAB — VITAMIN D 25 HYDROXY (VIT D DEFICIENCY, FRACTURES): Vit D, 25-Hydroxy: 23 ng/mL — ABNORMAL LOW (ref 30–100)

## 2021-12-18 LAB — LIPID PANEL W/REFLEX DIRECT LDL
Cholesterol: 185 mg/dL (ref <200)
HDL: 49 mg/dL — ABNORMAL LOW (ref >=50)
LDL Cholesterol (Calc): 108 mg/dL (calc) — ABNORMAL HIGH
Non-HDL Cholesterol (Calc): 136 mg/dL (calc) — ABNORMAL HIGH (ref <130)
Total CHOL/HDL Ratio: 3.8 (calc) (ref <5.0)
Triglycerides: 163 mg/dL — ABNORMAL HIGH (ref <150)

## 2021-12-18 LAB — VITAMIN B12: Vitamin B-12: 781 pg/mL (ref 200–1100)

## 2021-12-20 ENCOUNTER — Encounter: Payer: Self-pay | Admitting: Family Medicine

## 2022-03-05 ENCOUNTER — Other Ambulatory Visit: Payer: Self-pay | Admitting: Family Medicine

## 2022-03-05 ENCOUNTER — Other Ambulatory Visit (HOSPITAL_COMMUNITY): Payer: Self-pay

## 2022-03-05 DIAGNOSIS — F908 Attention-deficit hyperactivity disorder, other type: Secondary | ICD-10-CM

## 2022-03-05 MED ORDER — LISDEXAMFETAMINE DIMESYLATE 50 MG PO CAPS
50.0000 mg | ORAL_CAPSULE | ORAL | 0 refills | Status: DC
  Filled 2022-03-05: qty 90, 90d supply, fill #0

## 2022-03-06 ENCOUNTER — Other Ambulatory Visit (HOSPITAL_COMMUNITY): Payer: Self-pay

## 2022-03-17 ENCOUNTER — Encounter: Payer: Self-pay | Admitting: Family Medicine

## 2022-04-17 ENCOUNTER — Ambulatory Visit
Admission: RE | Admit: 2022-04-17 | Discharge: 2022-04-17 | Disposition: A | Discharge status: Home / Self Care | Payer: Managed Care, Other (non HMO) | Source: Ambulatory Visit

## 2022-04-17 VITALS — BP 142/88 | HR 71 | Temp 98.0°F | Resp 18

## 2022-04-17 DIAGNOSIS — Z202 Contact with and (suspected) exposure to infections with a predominantly sexual mode of transmission: Secondary | ICD-10-CM | POA: Diagnosis not present

## 2022-04-17 DIAGNOSIS — N898 Other specified noninflammatory disorders of vagina: Secondary | ICD-10-CM | POA: Diagnosis present

## 2022-04-17 NOTE — Discharge Instructions (Signed)
Check MyChart for your test results If any results are positive a nurse will call you with advice and treatment Avoid sexual relations until your results are available

## 2022-04-17 NOTE — ED Provider Notes (Signed)
[facility]    CSN: [id number] Arrival date & time: 04/17/22  0846      History   Chief Complaint Chief Complaint  Patient presents with   Exposure to STD    possible    HPI Natalie Hopkins is a 46 y.o. female.   HPI  Patient was notified by sexual partner that they have an STD.  Their partner had testing but those tests are pending.  Patient is here for testing.  She does have slight vaginal discharge.  No abdominal pain fever or rash.  Past Medical History:  Diagnosis Date   ADHD (attention deficit hyperactivity disorder)    takes vyvanse   Anal fissure    Anemia    comes and go   Anxiety    Arthritis    Back pain    Depression    Diverticulosis    on CT   Gallstones    GERD (gastroesophageal reflux disease)    Hypertension    pt lost weight and went away   Migraine    Morbid obesity (HCC)    Multiple gastric ulcers    Pancreatitis    Pre-diabetes     Patient Active Problem List   Diagnosis Date Noted   Well adult exam 12/16/2021   Eustachian tube dysfunction 03/19/2021   Mixed stress and urge urinary incontinence 02/16/2020   Amenorrhea 02/16/2020   Body mass index (BMI) of 40.1-44.9 in adult (HCC) 02/16/2020   Morbid obesity (HCC)    Bipolar disorder (HCC) 12/18/2017   Depressive disorder 12/18/2017   Impaired fasting glucose 12/18/2017   Sleep apnea 12/18/2017   Irritable bowel syndrome 07/01/2016   Abdominal pain, epigastric 07/01/2016   Gastroesophageal reflux disease 07/01/2016   GAD (generalized anxiety disorder) 05/29/2016   Attention deficit hyperactivity disorder (ADHD), inattentive type, 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   Hiatal hernia 08/31/2014   Status post bariatric surgery 01/04/2014   MDD (major depressive disorder), recurrent episode, moderate (HCC) 10/04/2013   Migraines 10/04/2013   Hypertension 01/06/2013    Past Surgical History:  Procedure Laterality Date   BARIATRIC SURGERY  2014   Sleeve Gsatrectomy    CHOLECYSTECTOMY  2000   ECTOPIC PREGNANCY SURGERY     twice, with removal of 1 tube 1997 and 1998   HYSTEROSCOPY WITH NOVASURE N/A 03/06/2021   Procedure: HYSTEROSCOPY; DILATATION AND CURETTAGE WIITH NOVASURE ABLATION;  Surgeon: Jerene Bears, MD;  Location: [facility];  Service: Gynecology;  Laterality: N/A;   TUBAL LIGATION      OB History     Gravida  5   Para  3   Term  3   Preterm      AB  2   Living         SAB      IAB      Ectopic  2   Multiple      Live Births  3            Home Medications    Prior to Admission medications   Medication Sig Start Date End Date Taking? Authorizing Provider  olmesartan (BENICAR) 20 MG tablet Take 20 mg by mouth daily. 04/09/22  Yes [provider]  buPROPion (WELLBUTRIN XL) 150 MG 24 hr tablet Take 150mg  daily x2 weeks then increase to 300mg  06/18/21   , DO  Cholecalciferol (VITAMIN D3) 30 MCG/15ML LIQD Take by mouth daily.    [provider]  fexofenadine (ALLEGRA ALLERGY) 180 MG tablet Take 1  tablet (180 mg total) by mouth daily. Patient taking differently: Take 180 mg by mouth at bedtime. 05/22/20   Sunnie Nielsen, DO  FLUoxetine (PROZAC) 20 MG capsule Take 1 capsule (20 mg total) by mouth daily. 06/18/21   Everrett Coombe, DO  fluticasone (FLONASE) 50 MCG/ACT nasal spray Place 2 sprays into both nostrils at bedtime.    [provider]  lisdexamfetamine (VYVANSE) 50 MG capsule Take 1 capsule (50 mg total) by mouth every morning. 03/05/22 06/04/22  Everrett Coombe, DO  meloxicam (MOBIC) 15 MG tablet TAKE 1 TABLET(15 MG) BY MOUTH DAILY 08/21/21   Kathryne Hitch, MD  meloxicam (MOBIC) 7.5 MG tablet Take 7.5 mg by mouth daily.    [provider]  metoprolol succinate (TOPROL-XL) 50 MG 24 hr tablet Take 1 tablet (50 mg total) by mouth daily. Take with or immediately following a meal. 03/19/21   Everrett Coombe, DO  omeprazole (PRILOSEC) 20 MG capsule Take  20 mg by mouth daily.    [provider]  scopolamine (TRANSDERM-SCOP) 1 MG/3DAYS Place 1 patch (1.5 mg total) onto the skin every 3 (three) days. 08/28/21   Everrett Coombe, DO  traZODone (DESYREL) 100 MG tablet Take 100 mg by mouth at bedtime.    [provider]  vitamin B-12 (CYANOCOBALAMIN) 100 MCG tablet Take 50 mcg by mouth daily.    [provider]    Family History Family History  Problem Relation Age of Onset   Asthma Father    Bladder Cancer Mother    Irregular heart beat Mother    Breast cancer Maternal Grandmother    Diabetes Paternal Grandfather    Ovarian cancer Maternal Aunt     Social History Social History   Tobacco Use   Smoking status: Never   Smokeless tobacco: Never  Vaping Use   Vaping Use: Never used  Substance Use Topics   Alcohol use: Yes    Comment: once a month   Drug use: No     Allergies   Patient has no known allergies.   Review of Systems Review of Systems  See HPI Physical Exam Triage Vital Signs ED Triage Vitals  Enc Vitals Group     BP 04/17/22 0857 (!) 142/88     Pulse Rate 04/17/22 0857 71     Resp 04/17/22 0857 18     Temp 04/17/22 0857 98 F (36.7 C)     Temp Source 04/17/22 0857 Oral     SpO2 04/17/22 0857 98 %     Weight --      Height --      Head Circumference --      Peak Flow --      Pain Score 04/17/22 0859 0     Pain Loc --      Pain Edu? --      Excl. in GC? --    No data found.  Updated Vital Signs BP (!) 142/88 (BP Location: Right Arm)   Pulse 71   Temp 98 F (36.7 C) (Oral)   Resp 18   SpO2 98%        Physical Exam Constitutional:      General: She is not in acute distress.    Appearance: She is well-developed.  HENT:     Head: Normocephalic and atraumatic.  Eyes:     Conjunctiva/sclera: Conjunctivae normal.     Pupils: Pupils are equal, round, and reactive to light.  Cardiovascular:     Rate and Rhythm: Normal  rate.  Pulmonary:     Effort: Pulmonary effort is  normal. No respiratory distress.  Musculoskeletal:        General: Normal range of motion.     Cervical back: Normal range of motion.  Skin:    General: Skin is warm and dry.  Neurological:     Mental Status: She is alert.      UC Treatments / Results  Labs (all labs ordered are listed, but only abnormal results are displayed) Labs Reviewed  CERVICOVAGINAL ANCILLARY ONLY    EKG   Radiology No results found.  Procedures Procedures (including critical care time)  Medications Ordered in UC Medications - No data to display  Initial Impression / Assessment and Plan / UC Course  I have reviewed the triage vital signs and the nursing notes.  Pertinent labs & imaging results that were available during my care of the patient were reviewed by me and considered in my medical decision making (see chart for details).    Discussed blood work.  Patient declines. Final Clinical Impressions(s) / UC Diagnoses   Final diagnoses:  Possible exposure to STD  Vaginal discharge     Discharge Instructions      Check MyChart for your test results If any results are positive a nurse will call you with advice and treatment Avoid sexual relations until your results are available     ED Prescriptions   None    PDMP not reviewed this encounter.   Eustace Moore, MD 04/17/22 [phone]

## 2022-04-17 NOTE — ED Triage Notes (Signed)
Pt c/o possible STD exposure. Is having some abnormal vaginal discharge x 2 days. States a sexual partner admitted to possibly being exposed to an STD, currently waiting on results according to partner.

## 2022-04-18 ENCOUNTER — Telehealth (HOSPITAL_COMMUNITY): Payer: Self-pay | Admitting: Emergency Medicine

## 2022-04-18 ENCOUNTER — Ambulatory Visit
Admission: EM | Admit: 2022-04-18 | Discharge: 2022-04-18 | Disposition: A | Discharge status: Home / Self Care | Payer: Managed Care, Other (non HMO) | Attending: Family Medicine | Admitting: Family Medicine

## 2022-04-18 DIAGNOSIS — Z202 Contact with and (suspected) exposure to infections with a predominantly sexual mode of transmission: Secondary | ICD-10-CM | POA: Diagnosis not present

## 2022-04-18 LAB — CERVICOVAGINAL ANCILLARY ONLY
Bacterial Vaginitis (gardnerella): POSITIVE — AB
Candida Glabrata: NEGATIVE
Candida Vaginitis: NEGATIVE
Chlamydia: POSITIVE — AB
Comment: NEGATIVE
Comment: NEGATIVE
Comment: NEGATIVE
Comment: NEGATIVE
Comment: NEGATIVE
Comment: NORMAL
Neisseria Gonorrhea: POSITIVE — AB
Trichomonas: NEGATIVE

## 2022-04-18 MED ORDER — METRONIDAZOLE 0.75 % VA GEL: 1.0000 | Freq: Every day | VAGINAL | 0 refills | Status: AC

## 2022-04-18 MED ORDER — CEFTRIAXONE SODIUM 500 MG IJ SOLR
500.0000 mg | Freq: Once | INTRAMUSCULAR | Status: AC
  Administered 2022-04-18: 500 mg via INTRAMUSCULAR

## 2022-04-18 MED ORDER — DOXYCYCLINE HYCLATE 100 MG PO CAPS: 100.0000 mg | ORAL_CAPSULE | Freq: Two times a day (BID) | ORAL | 0 refills | Status: AC

## 2022-04-18 NOTE — Telephone Encounter (Signed)
Per protocol, patient will need treatment with IM Rocephin 500mg  for positive Gonorrhea.  Will also need treatment with Doxycycline and Metrogel.   Prescription sent to pharmacy on file

## 2022-04-18 NOTE — ED Triage Notes (Signed)
Patient here for Rocephin 500mg  injection only for STI treatment.  Patient states that prescriptions are currently being filled will pick up later.  Patient voices understanding regarding medication and sexual practices.

## 2022-04-19 ENCOUNTER — Other Ambulatory Visit: Payer: Self-pay | Admitting: Family Medicine

## 2022-04-23 NOTE — Telephone Encounter (Signed)
Please contact patient to schedule HTN follow-up appt with Dr. Zigmund Daniel. Thanks

## 2022-05-07 ENCOUNTER — Ambulatory Visit: Payer: Managed Care, Other (non HMO) | Admitting: Family Medicine

## 2022-05-07 ENCOUNTER — Other Ambulatory Visit (HOSPITAL_COMMUNITY): Payer: Self-pay

## 2022-05-07 ENCOUNTER — Encounter: Payer: Self-pay | Admitting: Family Medicine

## 2022-05-07 VITALS — BP 137/84 | HR 74 | Ht 63.0 in | Wt 218.0 lb

## 2022-05-07 DIAGNOSIS — F411 Generalized anxiety disorder: Secondary | ICD-10-CM | POA: Diagnosis not present

## 2022-05-07 DIAGNOSIS — F908 Attention-deficit hyperactivity disorder, other type: Secondary | ICD-10-CM | POA: Diagnosis not present

## 2022-05-07 DIAGNOSIS — I10 Essential (primary) hypertension: Secondary | ICD-10-CM | POA: Diagnosis not present

## 2022-05-07 DIAGNOSIS — F900 Attention-deficit hyperactivity disorder, predominantly inattentive type: Secondary | ICD-10-CM

## 2022-05-07 MED ORDER — LISDEXAMFETAMINE DIMESYLATE 60 MG PO CAPS
60.0000 mg | ORAL_CAPSULE | ORAL | 0 refills | Status: DC
  Filled 2022-05-07 – 2022-06-16 (×2): qty 90, 90d supply, fill #0

## 2022-05-07 NOTE — Assessment & Plan Note (Signed)
She is taking combination of olmesartan and metoprolol.  Doing well with these at this time.  Medications are being managed by nephrology.  Recommend continuation.

## 2022-05-07 NOTE — Progress Notes (Signed)
Natalie Hopkins - 47 y.o. female MRN [id number]  Date of birth: Apr 17, 1976  Subjective Chief Complaint  Patient presents with   Hypertension    HPI Natalie Hopkins is a 47 year old female here today for follow-up visit.  She reports she has had some increase stress and anxiety recently.  Having some increased difficulty with focusing at work.  She does remain on Vyvanse 50 mg daily.  She is tolerating this well.  She has recently noted that her blood pressure was elevated and she has been seeing one of the nephrologist that she works with.  She was started on olmesartan as well as metoprolol.  She is doing well with these with pretty good control of her blood pressure.  Olmesartan was recently increased to 40 mg.  She denies chest pain, shortness of breath, palpitations, headaches or vision changes.  She has had recent labs through nephrology as well.  She was told that fluoxetine remains pretty effective at current strength.  ROS:  A comprehensive ROS was completed and negative except as noted per HPI   No Known Allergies  Past Medical History:  Diagnosis Date   ADHD (attention deficit hyperactivity disorder)    takes vyvanse   Anal fissure    Anemia    comes and go   Anxiety    Arthritis    Back pain    Depression    Diverticulosis    on CT   Gallstones    GERD (gastroesophageal reflux disease)    Hypertension    pt lost weight and went away   Migraine    Morbid obesity ([place])    Multiple gastric ulcers    Pancreatitis    Pre-diabetes     Past Surgical History:  Procedure Laterality Date   BARIATRIC SURGERY  2014   Sleeve Gsatrectomy   CHOLECYSTECTOMY  2000   ECTOPIC PREGNANCY SURGERY     twice, with removal of 1 tube 1997 and [place] N/A 03/06/2021   Procedure: HYSTEROSCOPY; [place];  Surgeon: Megan Salon, MD;  Location: [facility];  Service: Gynecology;  Laterality: N/A;   TUBAL LIGATION       Social History   Socioeconomic History   Marital status: Married    Spouse name: Not on file   Number of children: 3   Years of education: Not on file   Highest education level: Not on file  Occupational History   Occupation: insurance billing  Tobacco Use   Smoking status: Never   Smokeless tobacco: Never  Vaping Use   Vaping Use: Never used  Substance and Sexual Activity   Alcohol use: Yes    Comment: once a month   Drug use: No   Sexual activity: Yes    Partners: Male    Birth control/protection: Surgical  Other Topics Concern   Not on file  Social History Narrative   Not on file   Social Determinants of Health   Financial Resource Strain: Not on file  Food Insecurity: Not on file  Transportation Needs: Not on file  Physical Activity: Not on file  Stress: Not on file  Social Connections: Not on file    Family History  Problem Relation Age of Onset   Asthma Father    Bladder Cancer Mother    Irregular heart beat Mother    Breast cancer Maternal Grandmother    Diabetes Paternal Grandfather    Ovarian cancer Maternal Aunt  Health Maintenance  Topic Date Due   Hepatitis C Screening  Never done   COLONOSCOPY (Pts 45-9yrs Insurance coverage will need to be confirmed)  Never done   COVID-19 Vaccine (3 - 2023-24 season) 12/20/2021   PAP SMEAR-Modifier  11/01/2023   DTaP/Tdap/Td (3 - Td or Tdap) 09/15/2025   INFLUENZA VACCINE  Completed   HIV Screening  Completed   HPV VACCINES  Aged Out     ----------------------------------------------------------------------------------------------------------------------------------------------------------------------------------------------------------------- Physical Exam BP 137/84   Pulse 74   Ht 5\' 3"  (1.6 m)   Wt 218 lb (98.9 kg)   SpO2 100%   BMI 38.62 kg/m   Physical Exam Constitutional:      Appearance: Normal appearance.  Eyes:     General: No scleral icterus. Neurological:     General: No  focal deficit present.     Mental Status: She is alert.  Psychiatric:        Mood and Affect: Mood normal.        Behavior: Behavior normal.     ------------------------------------------------------------------------------------------------------------------------------------------------------------------------------------------------------------------- Assessment and Plan  Hypertension She is taking combination of olmesartan and metoprolol.  Doing well with these at this time.  Medications are being managed by nephrology.  Recommend continuation.  GAD (generalized anxiety disorder) She has had increase stress anxiety.  She has also had decreased focus at work.  Increasing her Vyvanse to 60 mg.  Discussed that if this does not improve things she may increase her fluoxetine to 40 mg daily.  Attention deficit hyperactivity disorder (ADHD), inattentive type, moderate Increasing Vyvanse to 60 mg daily. Return in about 2 months (around 07/06/2022) for F/u Mood/ADD.   Meds ordered this encounter  Medications   lisdexamfetamine (VYVANSE) 60 MG capsule    Sig: Take 1 capsule (60 mg total) by mouth every morning.    Dispense:  90 capsule    Refill:  0    Return in about 2 months (around 07/06/2022) for F/u Mood/ADD.    This visit occurred during the SARS-CoV-2 public health emergency.  Safety protocols were in place, including screening questions prior to the visit, additional usage of staff PPE, and extensive cleaning of exam room while observing appropriate contact time as indicated for disinfecting solutions.

## 2022-05-07 NOTE — Patient Instructions (Signed)
Great to see you today! Increase Vyvanse to 60mg  daily.  If anxiety persists over the next several weeks you can try an increase in your fluoxetine to 40mg  daily.  Let's follow up in 2-3 months.

## 2022-05-07 NOTE — Assessment & Plan Note (Signed)
She has had increase stress anxiety.  She has also had decreased focus at work.  Increasing her Vyvanse to 60 mg.  Discussed that if this does not improve things she may increase her fluoxetine to 40 mg daily.

## 2022-05-07 NOTE — Assessment & Plan Note (Signed)
Increasing Vyvanse to 60 mg daily. Return in about 2 months (around 07/06/2022) for F/u Mood/ADD.

## 2022-05-14 ENCOUNTER — Other Ambulatory Visit (HOSPITAL_COMMUNITY): Payer: Self-pay

## 2022-05-16 ENCOUNTER — Other Ambulatory Visit (HOSPITAL_COMMUNITY): Payer: Self-pay

## 2022-05-22 ENCOUNTER — Ambulatory Visit: Payer: Managed Care, Other (non HMO) | Admitting: Family Medicine

## 2022-05-27 ENCOUNTER — Other Ambulatory Visit: Payer: Self-pay | Admitting: Family Medicine

## 2022-06-16 ENCOUNTER — Other Ambulatory Visit (HOSPITAL_COMMUNITY): Payer: Self-pay

## 2022-06-16 ENCOUNTER — Other Ambulatory Visit: Payer: Self-pay

## 2022-06-24 ENCOUNTER — Encounter: Payer: Self-pay | Admitting: Family Medicine

## 2022-06-24 DIAGNOSIS — A749 Chlamydial infection, unspecified: Secondary | ICD-10-CM

## 2022-06-24 DIAGNOSIS — A549 Gonococcal infection, unspecified: Secondary | ICD-10-CM

## 2022-06-25 NOTE — Telephone Encounter (Signed)
Orders for GC/Chlamydia testing entered.  I don't think she would need testing for BV.

## 2022-06-27 ENCOUNTER — Other Ambulatory Visit: Payer: Self-pay

## 2022-06-27 MED ORDER — BUPROPION HCL ER (XL) 150 MG PO TB24: 300.0000 mg | ORAL_TABLET | Freq: Every day | ORAL | 0 refills | Status: DC

## 2022-06-27 MED ORDER — FLUOXETINE HCL 20 MG PO CAPS: 20.0000 mg | ORAL_CAPSULE | Freq: Every day | ORAL | 1 refills | Status: DC

## 2022-07-02 ENCOUNTER — Other Ambulatory Visit: Payer: Self-pay | Admitting: Family Medicine

## 2022-07-02 ENCOUNTER — Ambulatory Visit (INDEPENDENT_AMBULATORY_CARE_PROVIDER_SITE_OTHER): Payer: Managed Care, Other (non HMO) | Admitting: Family Medicine

## 2022-07-02 VITALS — Temp 97.7°F

## 2022-07-02 DIAGNOSIS — A549 Gonococcal infection, unspecified: Secondary | ICD-10-CM

## 2022-07-02 DIAGNOSIS — E785 Hyperlipidemia, unspecified: Secondary | ICD-10-CM | POA: Insufficient documentation

## 2022-07-02 LAB — CHLAMYDIA TRACHOMATIS,TMA(ALTERNATE TARGET),UROGENTIAL: Chlamydia Trachomatis,TMA(ALT Target),Urogenital: DETECTED — AB

## 2022-07-02 LAB — CHLAMYDIA/NEISSERIA GONORRHOEAE RNA,TMA,UROGENTIAL
C. trachomatis RNA, TMA: DETECTED — AB
N. gonorrhoeae RNA, TMA: DETECTED — AB

## 2022-07-02 LAB — NEISSERIA GONORRHOEAE, TMA (ALTERNATE TARGET), UROGENITAL: NEISSERIA GONORRHOEAE,TMA (ALT TARGET),UROGENITAL: DETECTED — AB

## 2022-07-02 MED ORDER — CEFTRIAXONE SODIUM 500 MG IJ SOLR
500.0000 mg | Freq: Once | INTRAMUSCULAR | Status: AC
  Administered 2022-07-02: 500 mg via INTRAMUSCULAR

## 2022-07-02 MED ORDER — DOXYCYCLINE HYCLATE 100 MG PO TABS: 100.0000 mg | ORAL_TABLET | Freq: Two times a day (BID) | ORAL | 0 refills | Status: DC

## 2022-07-02 MED ORDER — DOXYCYCLINE HYCLATE 100 MG PO TABS: 100.0000 mg | ORAL_TABLET | Freq: Two times a day (BID) | ORAL | 0 refills | Status: AC

## 2022-07-02 NOTE — Telephone Encounter (Signed)
I called and advised patient. She has been scheduled for today for injection. Ordered labs.

## 2022-07-02 NOTE — Progress Notes (Signed)
Medical screening examination/treatment was performed by qualified clinical staff member and as supervising physician I was immediately available for consultation/collaboration. I have reviewed documentation and agree with assessment and plan.  Angelle Isais, DO  

## 2022-07-02 NOTE — Progress Notes (Signed)
Patient given Ceftriaxone 500 MG  Location: [facility]

## 2022-07-02 NOTE — Addendum Note (Signed)
Addended by: Narda Rutherford on: 07/02/2022 08:27 AM   Modules accepted: Orders

## 2022-07-07 ENCOUNTER — Encounter: Payer: Self-pay | Admitting: Family Medicine

## 2022-07-07 ENCOUNTER — Telehealth (INDEPENDENT_AMBULATORY_CARE_PROVIDER_SITE_OTHER): Payer: Managed Care, Other (non HMO) | Admitting: Family Medicine

## 2022-07-07 DIAGNOSIS — F32A Depression, unspecified: Secondary | ICD-10-CM | POA: Diagnosis not present

## 2022-07-07 DIAGNOSIS — F908 Attention-deficit hyperactivity disorder, other type: Secondary | ICD-10-CM | POA: Diagnosis not present

## 2022-07-07 DIAGNOSIS — A549 Gonococcal infection, unspecified: Secondary | ICD-10-CM

## 2022-07-07 DIAGNOSIS — F900 Attention-deficit hyperactivity disorder, predominantly inattentive type: Secondary | ICD-10-CM

## 2022-07-07 DIAGNOSIS — A749 Chlamydial infection, unspecified: Secondary | ICD-10-CM

## 2022-07-07 MED ORDER — LISDEXAMFETAMINE DIMESYLATE 60 MG PO CAPS: 60.0000 mg | ORAL_CAPSULE | ORAL | 0 refills | Status: DC

## 2022-07-07 MED ORDER — DOXEPIN HCL 10 MG PO CAPS: 10.0000 mg | ORAL_CAPSULE | Freq: Every evening | ORAL | 1 refills | Status: DC | PRN

## 2022-07-07 MED ORDER — METOPROLOL SUCCINATE ER 50 MG PO TB24: ORAL_TABLET | ORAL | 0 refills | Status: DC

## 2022-07-07 MED ORDER — BUPROPION HCL ER (XL) 150 MG PO TB24: 300.0000 mg | ORAL_TABLET | Freq: Every day | ORAL | 1 refills | Status: DC

## 2022-07-07 NOTE — Assessment & Plan Note (Signed)
She continues to do well with Vyvanse at current strength.  Will plan to continue this.

## 2022-07-07 NOTE — Progress Notes (Signed)
Attempted to contact the patient. LVM for call back.

## 2022-07-07 NOTE — Progress Notes (Signed)
Natalie Hopkins - 47 y.o. female MRN [id number]  Date of birth: 1975/06/24   This visit type was conducted due to national recommendations for restrictions regarding the COVID-19 Pandemic (e.g. social distancing).  This format is felt to be most appropriate for this patient at this time.  All issues noted in this document were discussed and addressed.  No physical exam was performed (except for noted visual exam findings with Video Visits).  I discussed the limitations of evaluation and management by telemedicine and the availability of in person appointments. The patient expressed understanding and agreed to proceed.  I connected withNAME@ on 07/07/22 at  3:30 PM EDT by a video enabled telemedicine application and verified that I am speaking with the correct person using two identifiers.  Present at visit: Luetta Nutting, DO [place]   Patient Location: Home [place] [address]   Provider location:   [facility]  Chief Complaint  Patient presents with   Mood    HPI  Daila Koetter is a 47 y.o. female who presents via audio/video conferencing for a telehealth visit today.  She is following up today for ADHD.  Reports she is doing well with Vyvanse.  No side effects at current strength.  She does continue to have some insomnia but this is chronic.  She does not feel that trazodone is very effective as she often wakes up several times throughout the night.    Depression is stable with current strength of fluoxetine and bupropion.  Recently treated again for gonorrhea and chlamydia.  This will be her second treatment for this as she continued to test positive after previous treatment..  She does report that her partner was treated as well.  Partner has not had repeat testing.  She denies symptoms at this time.  Given Rocephin 500 mg.  She reports that she still has a few days of doxycycline left.   ROS:  A comprehensive ROS was completed and negative except as noted per  HPI  Past Medical History:  Diagnosis Date   ADHD (attention deficit hyperactivity disorder)    takes vyvanse   Anal fissure    Anemia    comes and go   Anxiety    Arthritis    Back pain    Depression    Diverticulosis    on CT   Gallstones    GERD (gastroesophageal reflux disease)    Hypertension    pt lost weight and went away   Migraine    Morbid obesity ([place])    Multiple gastric ulcers    Pancreatitis    Pre-diabetes     Past Surgical History:  Procedure Laterality Date   BARIATRIC SURGERY  2014   Sleeve Gsatrectomy   CHOLECYSTECTOMY  2000   ECTOPIC PREGNANCY SURGERY     twice, with removal of 1 tube 1997 and [place] N/A 03/06/2021   Procedure: HYSTEROSCOPY; [place];  Surgeon: Megan Salon, MD;  Location: [facility];  Service: Gynecology;  Laterality: N/A;   TUBAL LIGATION      Family History  Problem Relation Age of Onset   Asthma Father    Bladder Cancer Mother    Irregular heart beat Mother    Breast cancer Maternal Grandmother    Diabetes Paternal Grandfather    Ovarian cancer Maternal Aunt     Social History   Socioeconomic History   Marital status: Married    Spouse name:  Not on file   Number of children: 3   Years of education: Not on file   Highest education level: Not on file  Occupational History   Occupation: insurance billing  Tobacco Use   Smoking status: Never   Smokeless tobacco: Never  Vaping Use   Vaping Use: Never used  Substance and Sexual Activity   Alcohol use: Yes    Comment: once a month   Drug use: No   Sexual activity: Yes    Partners: Male    Birth control/protection: Surgical  Other Topics Concern   Not on file  Social History Narrative   Not on file   Social Determinants of Health   Financial Resource Strain: Not on file  Food Insecurity: Not on file  Transportation Needs: Not on file  Physical Activity: Not on file   Stress: Not on file  Social Connections: Not on file  Intimate Partner Violence: Not on file     Current Outpatient Medications:    doxepin (SINEQUAN) 10 MG capsule, Take 1-2 capsules (10-20 mg total) by mouth at bedtime as needed., Disp: 90 capsule, Rfl: 1   buPROPion (WELLBUTRIN XL) 150 MG 24 hr tablet, Take 2 tablets (300 mg total) by mouth daily., Disp: 180 tablet, Rfl: 1   Cholecalciferol (VITAMIN D3) 30 MCG/15ML LIQD, Take by mouth daily., Disp: , Rfl:    doxycycline (VIBRA-TABS) 100 MG tablet, Take 1 tablet (100 mg total) by mouth 2 (two) times daily for 7 days., Disp: 14 tablet, Rfl: 0   fexofenadine (ALLEGRA ALLERGY) 180 MG tablet, Take 1 tablet (180 mg total) by mouth daily. (Patient taking differently: Take 180 mg by mouth at bedtime.), Disp: 90 tablet, Rfl: 3   FLUoxetine (PROZAC) 20 MG capsule, Take 1 capsule (20 mg total) by mouth daily., Disp: 90 capsule, Rfl: 1   fluticasone (FLONASE) 50 MCG/ACT nasal spray, Place 2 sprays into both nostrils at bedtime., Disp: , Rfl:    lisdexamfetamine (VYVANSE) 60 MG capsule, Take 1 capsule (60 mg total) by mouth every morning., Disp: 90 capsule, Rfl: 0   metoprolol succinate (TOPROL-XL) 50 MG 24 hr tablet, TAKE 1 TABLET(50 MG) BY MOUTH DAILY WITH OR IMMEDIATELY FOLLOWING A MEAL, Disp: 90 tablet, Rfl: 0   olmesartan (BENICAR) 20 MG tablet, Take 40 mg by mouth daily., Disp: , Rfl:    omeprazole (PRILOSEC) 20 MG capsule, Take 20 mg by mouth daily., Disp: , Rfl:    scopolamine (TRANSDERM-SCOP) 1 MG/3DAYS, Place 1 patch (1.5 mg total) onto the skin every 3 (three) days., Disp: 4 patch, Rfl: 0   traZODone (DESYREL) 100 MG tablet, Take 100 mg by mouth at bedtime., Disp: , Rfl:    vitamin B-12 (CYANOCOBALAMIN) 100 MCG tablet, Take 50 mcg by mouth daily., Disp: , Rfl:   EXAM:  VITALS per patient if applicable: There were no vitals taken for this visit.  GENERAL: alert, oriented, appears well and in no acute distress  HEENT: atraumatic,  conjunttiva clear, no obvious abnormalities on inspection of external nose and ears  NECK: normal movements of the head and neck  LUNGS: on inspection no signs of respiratory distress, breathing rate appears normal, no obvious gross SOB, gasping or wheezing  CV: no obvious cyanosis  MS: moves all visible extremities without noticeable abnormality  PSYCH/NEURO: pleasant and cooperative, no obvious depression or anxiety, speech and thought processing grossly intact  ASSESSMENT AND PLAN:  Discussed the following assessment and plan:  Attention deficit hyperactivity disorder (ADHD), inattentive type, moderate  She continues to do well with Vyvanse at current strength.  Will plan to continue this.  Depressive disorder Doing well on fluoxetine and bupropion at current strength.  Will plan to continue these medications at current strength.  She is having some increased insomnia.  Discontinue trazodone and add doxepin.  Gonorrhea Status post treatment with Rocephin 500 mg.  She will return to have this rechecked in approximately 2 weeks.  Chlamydia Complete course of doxycycline.  Recheck in 2 weeks.     I discussed the assessment and treatment plan with the patient. The patient was provided an opportunity to ask questions and all were answered. The patient agreed with the plan and demonstrated an understanding of the instructions.   The patient was advised to call back or seek an in-person evaluation if the symptoms worsen or if the condition fails to improve as anticipated.    Luetta Nutting, DO

## 2022-07-07 NOTE — Assessment & Plan Note (Signed)
Doing well on fluoxetine and bupropion at current strength.  Will plan to continue these medications at current strength.  She is having some increased insomnia.  Discontinue trazodone and add doxepin.

## 2022-07-07 NOTE — Assessment & Plan Note (Signed)
Status post treatment with Rocephin 500 mg.  She will return to have this rechecked in approximately 2 weeks.

## 2022-07-07 NOTE — Assessment & Plan Note (Signed)
Complete course of doxycycline.  Recheck in 2 weeks.

## 2022-07-16 ENCOUNTER — Encounter: Payer: Self-pay | Admitting: Family Medicine

## 2022-09-25 ENCOUNTER — Other Ambulatory Visit: Payer: Self-pay | Admitting: Family Medicine

## 2022-09-25 DIAGNOSIS — F908 Attention-deficit hyperactivity disorder, other type: Secondary | ICD-10-CM

## 2022-09-26 ENCOUNTER — Other Ambulatory Visit (HOSPITAL_BASED_OUTPATIENT_CLINIC_OR_DEPARTMENT_OTHER): Payer: Self-pay

## 2022-09-26 ENCOUNTER — Other Ambulatory Visit: Payer: Self-pay

## 2022-09-26 MED ORDER — LISDEXAMFETAMINE DIMESYLATE 60 MG PO CAPS
60.0000 mg | ORAL_CAPSULE | ORAL | 0 refills | Status: DC
  Filled 2022-09-26: qty 30, 30d supply, fill #0

## 2022-09-26 NOTE — Telephone Encounter (Signed)
Requesting Vyvanse 60mg  Last written 07/07/2022 Last OV 07/07/2022 No upcoming appt schld.

## 2022-09-29 ENCOUNTER — Other Ambulatory Visit (HOSPITAL_BASED_OUTPATIENT_CLINIC_OR_DEPARTMENT_OTHER): Payer: Self-pay

## 2022-09-29 ENCOUNTER — Encounter: Payer: Self-pay | Admitting: Family Medicine

## 2022-10-02 ENCOUNTER — Other Ambulatory Visit (HOSPITAL_COMMUNITY): Payer: Self-pay

## 2022-10-06 ENCOUNTER — Other Ambulatory Visit: Payer: Self-pay

## 2022-10-06 ENCOUNTER — Other Ambulatory Visit (HOSPITAL_BASED_OUTPATIENT_CLINIC_OR_DEPARTMENT_OTHER): Payer: Self-pay

## 2022-10-06 MED ORDER — AMPHETAMINE-DEXTROAMPHET ER 20 MG PO CP24: 20.0000 mg | ORAL_CAPSULE | Freq: Every day | ORAL | 0 refills | Status: DC

## 2022-10-08 MED ORDER — AMPHETAMINE-DEXTROAMPHET ER 30 MG PO CP24: 30.0000 mg | ORAL_CAPSULE | ORAL | 0 refills | Status: DC

## 2022-10-08 NOTE — Telephone Encounter (Signed)
Adderall XR 30mg  sent in.   Thanks!  CM

## 2022-12-03 ENCOUNTER — Telehealth: Payer: Self-pay | Admitting: General Practice

## 2022-12-03 NOTE — Transitions of Care (Post Inpatient/ED Visit) (Signed)
12/03/2022  Name: Natalie Hopkins MRN: [id number] DOB: Jun 03, 1975  Today's TOC FU Call Status: Today's TOC FU Call Status:: Successful TOC FU Call Completed TOC FU Call Complete Date: 12/03/22  Transition Care Management Follow-up Telephone Call Date of Discharge: 12/03/22 Discharge Facility: Other ([facility]) Name of Other ([facility]) Discharge Facility: [facility] Type of Discharge: Emergency Department Reason for ED Visit: Other: (food bolus obstruction) How have you been since you were released from the hospital?: Better Any questions or concerns?: No  Items Reviewed: Did you receive and understand the discharge instructions provided?: Yes Medications obtained,verified, and reconciled?: Yes (Medications Reviewed) Any new allergies since your discharge?: No Dietary orders reviewed?: NA Do you have support at home?: Yes  Medications Reviewed Today: Medications Reviewed Today     Reviewed by Modesto Charon, RN (Registered Nurse) on 12/03/22 at 1000  Med List Status: <None>   Medication Order Taking? Sig Documenting Provider Last Dose Status Informant  amphetamine-dextroamphetamine (ADDERALL XR) 30 MG 24 hr capsule [id number]  Take 1 capsule (30 mg total) by mouth every morning. Everrett Coombe, DO  Active   buPROPion (WELLBUTRIN XL) 150 MG 24 hr tablet [id number]  Take 2 tablets (300 mg total) by mouth daily. Everrett Coombe, DO  Expired 08/06/22 2359   Cholecalciferol (VITAMIN D3) 30 MCG/15ML LIQD [id number] No Take by mouth daily. [provider] Taking Active Self  doxepin (SINEQUAN) 10 MG capsule [id number]  Take 1-2 capsules (10-20 mg total) by mouth at bedtime as needed. Everrett Coombe, DO  Active   fexofenadine [facility] ALLERGY) 180 MG tablet [id number] No Take 1 tablet (180 mg total) by mouth daily.  Patient taking differently: Take 180 mg by mouth at bedtime.   Sunnie Nielsen, DO Taking Active Self  FLUoxetine (PROZAC) 20 MG capsule [id number]   Take 1 capsule (20 mg total) by mouth daily. Everrett Coombe, DO  Active   fluticasone (FLONASE) 50 MCG/ACT nasal spray [id number] No Place 2 sprays into both nostrils at bedtime. [provider] Taking Active Self  metoprolol succinate (TOPROL-XL) 50 MG 24 hr tablet [id number]  TAKE 1 TABLET(50 MG) BY MOUTH DAILY WITH OR IMMEDIATELY FOLLOWING A MEAL Ashley Royalty, Cody, DO  Active   olmesartan (BENICAR) 20 MG tablet [id number] No Take 40 mg by mouth daily. [provider] Taking Active   omeprazole (PRILOSEC) 20 MG capsule [id number] No Take 20 mg by mouth daily. [provider] Taking Active Self  scopolamine (TRANSDERM-SCOP) 1 MG/3DAYS [id number] No Place 1 patch (1.5 mg total) onto the skin every 3 (three) days. Everrett Coombe, DO Taking Active   traZODone (DESYREL) 100 MG tablet [id number] No Take 100 mg by mouth at bedtime. [provider] Taking Active Self  vitamin B-12 (CYANOCOBALAMIN) 100 MCG tablet [id number] No Take 50 mcg by mouth daily. [provider] Taking Active Self            Home Care and Equipment/Supplies: Were Home Health Services Ordered?: NA Any new equipment or medical supplies ordered?: NA  Functional Questionnaire: Do you need assistance with bathing/showering or dressing?: No Do you need assistance with meal preparation?: No Do you need assistance with eating?: No Do you have difficulty maintaining continence: No Do you need assistance with getting out of bed/getting out of a chair/moving?: No Do you have difficulty managing or taking your medications?: No  Follow up appointments reviewed: PCP Follow-up appointment confirmed?: NA Specialist Hospital Follow-up appointment confirmed?: NA (Patient stated that GI is suppose to call her with  biopsy results.) Do you need transportation to your follow-up appointment?: No Do you understand care options if your condition(s) worsen?: Yes-patient verbalized understanding  SDOH  Interventions Today    Flowsheet Row Most Recent Value  SDOH Interventions   Transportation Interventions Intervention Not Indicated       SIGNATURE Modesto Charon, RN BSN Nurse Health Advisor

## 2022-12-16 ENCOUNTER — Ambulatory Visit
Admission: RE | Admit: 2022-12-16 | Discharge: 2022-12-16 | Disposition: A | Discharge status: Home / Self Care | Payer: Medicaid Other | Source: Ambulatory Visit | Attending: Family Medicine | Admitting: Family Medicine

## 2022-12-16 ENCOUNTER — Other Ambulatory Visit: Payer: Self-pay

## 2022-12-16 VITALS — BP 158/97 | HR 90 | Temp 98.2°F | Resp 18

## 2022-12-16 DIAGNOSIS — Z202 Contact with and (suspected) exposure to infections with a predominantly sexual mode of transmission: Secondary | ICD-10-CM | POA: Diagnosis not present

## 2022-12-16 DIAGNOSIS — Z113 Encounter for screening for infections with a predominantly sexual mode of transmission: Secondary | ICD-10-CM | POA: Insufficient documentation

## 2022-12-16 DIAGNOSIS — I10 Essential (primary) hypertension: Secondary | ICD-10-CM | POA: Insufficient documentation

## 2022-12-16 NOTE — ED Triage Notes (Signed)
Pt requesting STI check. Denies any known exposures, but had unprotected intercourse. Denies any sxs.

## 2022-12-16 NOTE — Discharge Instructions (Addendum)
Advised patient we will follow-up with lab results once received.  Advised if symptoms worsen and/or unresolved please follow-up with PCP or here for further evaluation.

## 2022-12-16 NOTE — ED Provider Notes (Signed)
[facility]    CSN: [id number] Arrival date & time: 12/16/22  1719      History   Chief Complaint Chief Complaint  Patient presents with   STI Check    HPI Natalie Hopkins is a 47 y.o. female.   HPI 47 year old female presents with request for STD screening.  Reports recent unprotected intercourse.  PMH significant for obesity, ADHD, and HTN.  Past Medical History:  Diagnosis Date   ADHD (attention deficit hyperactivity disorder)    takes vyvanse   Anal fissure    Anemia    comes and go   Anxiety    Arthritis    Back pain    Depression    Diverticulosis    on CT   Gallstones    GERD (gastroesophageal reflux disease)    Hypertension    pt lost weight and went away   Migraine    Morbid obesity (HCC)    Multiple gastric ulcers    Pancreatitis    Pre-diabetes     Patient Active Problem List   Diagnosis Date Noted   Gonorrhea 07/07/2022   Chlamydia 07/07/2022   Hyperlipidemia with target LDL less than 100 07/02/2022   Well adult exam 12/16/2021   Eustachian tube dysfunction 03/19/2021   Mixed stress and urge urinary incontinence 02/16/2020   Amenorrhea 02/16/2020   Body mass index (BMI) of 40.1-44.9 in adult (HCC) 02/16/2020   Morbid obesity (HCC)    Bipolar disorder (HCC) 12/18/2017   Depressive disorder 12/18/2017   Impaired fasting glucose 12/18/2017   Sleep apnea 12/18/2017   Irritable bowel syndrome 07/01/2016   Abdominal pain, epigastric 07/01/2016   Gastroesophageal reflux disease 07/01/2016   GAD (generalized anxiety disorder) 05/29/2016   Attention deficit hyperactivity disorder (ADHD), inattentive type, 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   Hiatal hernia 08/31/2014   Status post bariatric surgery 01/04/2014   MDD (major depressive disorder), recurrent episode, moderate (HCC) 10/04/2013   Migraines 10/04/2013   Hypertension 01/06/2013    Past Surgical History:  Procedure Laterality Date   ABLATION     BARIATRIC SURGERY  2014   Sleeve  Gsatrectomy   CHOLECYSTECTOMY  2000   ECTOPIC PREGNANCY SURGERY     twice, with removal of 1 tube 1997 and 1998   ESOPHAGOGASTRODUODENOSCOPY     August 2024 for foreign body   HYSTEROSCOPY WITH NOVASURE N/A 03/06/2021   Procedure: HYSTEROSCOPY; DILATATION AND CURETTAGE WIITH NOVASURE ABLATION;  Surgeon: Jerene Bears, MD;  Location: [facility] [place];  Service: Gynecology;  Laterality: N/A;   TUBAL LIGATION      OB History     Gravida  5   Para  3   Term  3   Preterm      AB  2   Living         SAB      IAB      Ectopic  2   Multiple      Live Births  3            Home Medications    Prior to Admission medications   Medication Sig Start Date End Date Taking? Authorizing Provider  buPROPion (WELLBUTRIN XL) 150 MG 24 hr tablet Take 2 tablets (300 mg total) by mouth daily. 07/07/22 12/16/22 Yes Everrett Coombe, DO  chlorthalidone (HYGROTON) 25 MG tablet Take 25 mg by mouth daily. Specialized dosing   Yes [provider]  Cholecalciferol (VITAMIN D3) 30 MCG/15ML LIQD Take by mouth daily.   Yes [provider]  FLUoxetine (PROZAC) 20 MG capsule Take 1 capsule (20 mg total) by mouth daily. 06/27/22  Yes Everrett Coombe, DO  fluticasone (FLONASE) 50 MCG/ACT nasal spray Place 2 sprays into both nostrils at bedtime.   Yes [provider]  metoprolol succinate (TOPROL-XL) 50 MG 24 hr tablet TAKE 1 TABLET(50 MG) BY MOUTH DAILY WITH OR IMMEDIATELY FOLLOWING A MEAL 07/07/22  Yes Ashley Royalty, Cody, DO  olmesartan (BENICAR) 20 MG tablet Take 40 mg by mouth daily. 04/09/22  Yes [provider]  omeprazole (PRILOSEC) 20 MG capsule Take 20 mg by mouth daily.   Yes [provider]  amphetamine-dextroamphetamine (ADDERALL XR) 30 MG 24 hr capsule Take 1 capsule (30 mg total) by mouth every morning. 10/08/22   Everrett Coombe, DO  doxepin (SINEQUAN) 10 MG capsule Take 1-2 capsules (10-20 mg total) by mouth at bedtime as needed. 07/07/22    Everrett Coombe, DO  fexofenadine [facility] ALLERGY) 180 MG tablet Take 1 tablet (180 mg total) by mouth daily. Patient taking differently: Take 180 mg by mouth at bedtime. 05/22/20   Sunnie Nielsen, DO  scopolamine (TRANSDERM-SCOP) 1 MG/3DAYS Place 1 patch (1.5 mg total) onto the skin every 3 (three) days. 08/28/21   Everrett Coombe, DO  traZODone (DESYREL) 100 MG tablet Take 100 mg by mouth at bedtime.    [provider]  vitamin B-12 (CYANOCOBALAMIN) 100 MCG tablet Take 50 mcg by mouth daily.    [provider]    Family History Family History  Problem Relation Age of Onset   Bladder Cancer Mother    Irregular heart beat Mother    Asthma Father    Breast cancer Maternal Grandmother    Diabetes Paternal Grandfather    Ovarian cancer Maternal Aunt     Social History Social History   Tobacco Use   Smoking status: Never   Smokeless tobacco: Never  Vaping Use   Vaping status: Never Used  Substance Use Topics   Alcohol use: Yes    Comment: occasionally   Drug use: No     Allergies   Patient has no known allergies.   Review of Systems Review of Systems   Physical Exam Triage Vital Signs ED Triage Vitals  Encounter Vitals Group     BP 12/16/22 1734 (!) 158/97     Systolic BP Percentile --      Diastolic BP Percentile --      Pulse Rate 12/16/22 1734 90     Resp 12/16/22 1734 18     Temp 12/16/22 1734 98.2 F (36.8 C)     Temp Source 12/16/22 1734 Oral     SpO2 12/16/22 1734 97 %     Weight --      Height --      Head Circumference --      Peak Flow --      Pain Score 12/16/22 1736 0     Pain Loc --      Pain Education --      Exclude from Growth Chart --    No data found.  Updated Vital Signs BP (!) 158/97 Comment: states took HTN med today after filling Rx, but had ran out and not taken any for past 2 days  Pulse 90   Temp 98.2 F (36.8 C) (Oral)   Resp 18   SpO2 97%   Visual Acuity Right Eye Distance:   Left Eye Distance:    Bilateral Distance:    Right Eye Near:  Left Eye Near:    Bilateral Near:     Physical Exam Vitals and nursing note reviewed.  Constitutional:      Appearance: Normal appearance. She is normal weight.  HENT:     Head: Normocephalic and atraumatic.     Right Ear: Tympanic membrane, ear canal and external ear normal.     Left Ear: Tympanic membrane, ear canal and external ear normal.     Nose: Nose normal.     Mouth/Throat:     Mouth: Mucous membranes are moist.     Pharynx: Oropharynx is clear.  Eyes:     Extraocular Movements: Extraocular movements intact.     Conjunctiva/sclera: Conjunctivae normal.     Pupils: Pupils are equal, round, and reactive to light.  Cardiovascular:     Rate and Rhythm: Normal rate and regular rhythm.     Pulses: Normal pulses.     Heart sounds: Normal heart sounds.  Pulmonary:     Effort: Pulmonary effort is normal.     Breath sounds: Normal breath sounds. No wheezing, rhonchi or rales.  Musculoskeletal:        General: Normal range of motion.     Cervical back: Normal range of motion and neck supple.  Skin:    General: Skin is warm and dry.  Neurological:     General: No focal deficit present.     Mental Status: She is alert and oriented to person, place, and time. Mental status is at baseline.  Psychiatric:        Mood and Affect: Mood normal.        Behavior: Behavior normal.        Thought Content: Thought content normal.      UC Treatments / Results  Labs (all labs ordered are listed, but only abnormal results are displayed) Labs Reviewed  HIV ANTIBODY (ROUTINE TESTING W REFLEX)  RPR  HEPATITIS C ANTIBODY  CERVICOVAGINAL ANCILLARY ONLY    EKG   Radiology No results found.  Procedures Procedures (including critical care time)  Medications Ordered in UC Medications - No data to display  Initial Impression / Assessment and Plan / UC Course  I have reviewed the triage vital signs and the nursing notes.  Pertinent  labs & imaging results that were available during my care of the patient were reviewed by me and considered in my medical decision making (see chart for details).     Potential exposure to STD-Aptima swab ordered (GC chlamydia, trichomonas) HIV antibody routine testing with reflex ordered, RPR, hep C antibody ordered. Advised patient we will follow-up with lab results once received.  Advised if symptoms worsen and/or unresolved please follow-up with PCP or here for further evaluation.  Patient discharged home, hemodynamically stable. Final Clinical Impressions(s) / UC Diagnoses   Final diagnoses:  Potential exposure to STD     Discharge Instructions      Advised patient we will follow-up with lab results once received.  Advised if symptoms worsen and/or unresolved please follow-up with PCP or here for further evaluation.     ED Prescriptions   None    PDMP not reviewed this encounter.   Trevor Iha, FNP 12/16/22 1831

## 2022-12-17 LAB — RPR: RPR Ser Ql: NONREACTIVE

## 2022-12-17 LAB — HIV ANTIBODY (ROUTINE TESTING W REFLEX): HIV Screen 4th Generation wRfx: NONREACTIVE

## 2022-12-17 LAB — HEPATITIS C ANTIBODY: Hep C Virus Ab: NONREACTIVE

## 2022-12-25 LAB — CERVICOVAGINAL ANCILLARY ONLY
Chlamydia: NEGATIVE
Comment: NEGATIVE
Comment: NEGATIVE
Comment: NORMAL
Neisseria Gonorrhea: NEGATIVE
Trichomonas: NEGATIVE

## 2023-02-11 ENCOUNTER — Ambulatory Visit: Payer: Medicaid Other | Admitting: Family Medicine

## 2023-02-12 ENCOUNTER — Encounter: Payer: Self-pay | Admitting: Family Medicine

## 2023-02-12 ENCOUNTER — Ambulatory Visit: Payer: Medicaid Other | Admitting: Family Medicine

## 2023-02-12 VITALS — BP 107/65 | HR 67 | Ht 63.0 in | Wt 214.0 lb

## 2023-02-12 DIAGNOSIS — F900 Attention-deficit hyperactivity disorder, predominantly inattentive type: Secondary | ICD-10-CM | POA: Diagnosis not present

## 2023-02-12 DIAGNOSIS — I10 Essential (primary) hypertension: Secondary | ICD-10-CM | POA: Diagnosis not present

## 2023-02-12 DIAGNOSIS — F5101 Primary insomnia: Secondary | ICD-10-CM

## 2023-02-12 DIAGNOSIS — F411 Generalized anxiety disorder: Secondary | ICD-10-CM | POA: Diagnosis not present

## 2023-02-12 DIAGNOSIS — G4700 Insomnia, unspecified: Secondary | ICD-10-CM | POA: Insufficient documentation

## 2023-02-12 DIAGNOSIS — Z23 Encounter for immunization: Secondary | ICD-10-CM

## 2023-02-12 MED ORDER — CHLORTHALIDONE 25 MG PO TABS
25.0000 mg | ORAL_TABLET | Freq: Every day | ORAL | 3 refills | Status: AC
  Filled 2023-02-12 – 2023-06-02 (×3): qty 90, 90d supply, fill #0
  Filled 2023-08-28: qty 90, 90d supply, fill #1

## 2023-02-12 MED ORDER — BUPROPION HCL ER (XL) 300 MG PO TB24
300.0000 mg | ORAL_TABLET | Freq: Every day | ORAL | 3 refills | Status: DC
  Filled 2023-02-12: qty 90, 90d supply, fill #0
  Filled 2023-06-02: qty 90, 90d supply, fill #1

## 2023-02-12 MED ORDER — FLUOXETINE HCL 20 MG PO CAPS
20.0000 mg | ORAL_CAPSULE | Freq: Every day | ORAL | 3 refills | Status: DC
  Filled 2023-02-12: qty 90, 90d supply, fill #0
  Filled 2023-06-02: qty 90, 90d supply, fill #1

## 2023-02-12 MED ORDER — DOXEPIN HCL 10 MG PO CAPS
10.0000 mg | ORAL_CAPSULE | Freq: Every evening | ORAL | 1 refills | Status: DC | PRN
  Filled 2023-02-12: qty 90, 45d supply, fill #0

## 2023-02-12 MED ORDER — OLMESARTAN MEDOXOMIL 20 MG PO TABS
40.0000 mg | ORAL_TABLET | Freq: Every day | ORAL | 3 refills | Status: DC
  Filled 2023-02-12: qty 90, 45d supply, fill #0
  Filled 2023-06-02: qty 60, 30d supply, fill #0
  Filled 2023-08-28: qty 90, 45d supply, fill #0
  Filled 2023-12-02 – 2024-02-02 (×3): qty 90, 45d supply, fill #1

## 2023-02-12 MED ORDER — AMPHETAMINE-DEXTROAMPHET ER 30 MG PO CP24
30.0000 mg | ORAL_CAPSULE | ORAL | 0 refills | Status: DC
Start: 2023-02-12 — End: 2023-06-02
  Filled 2023-02-12: qty 90, 90d supply, fill #0

## 2023-02-12 MED ORDER — METOPROLOL SUCCINATE ER 50 MG PO TB24
50.0000 mg | ORAL_TABLET | Freq: Every day | ORAL | 3 refills | Status: DC
  Filled 2023-02-12 – 2023-06-02 (×2): qty 90, 90d supply, fill #0
  Filled 2023-08-28: qty 90, 90d supply, fill #1
  Filled 2023-12-02 – 2024-02-02 (×3): qty 90, 90d supply, fill #2

## 2023-02-12 NOTE — Progress Notes (Signed)
Natalie Hopkins - 47 y.o. female MRN [id number]  Date of birth: 1975/11/20  Subjective No chief complaint on file.   HPI Natalie Hopkins is a 47 y.o. female here today for follow up .  Continues to have difficulty with sleep and anxiety.  She was prescribed trazodone and doxepin. Tried trazodone, however this left her feeling "hungover".  Doesn't think she tried doxepin.  She has been out of fluoxetine for several weeks now.  Remains on bupropion.    She would like for me to take over her anti-hypertensive medications.  Doing well with combination of olmesartan, hydrochlorothiazide and metoprolol.  Denies chest pain, shortness of breath, palpitations, headache or vision changes.   ROS:  A comprehensive ROS was completed and negative except as noted per HPI  No Known Allergies  Past Medical History:  Diagnosis Date   ADHD (attention deficit hyperactivity disorder)    takes vyvanse   Anal fissure    Anemia    comes and go   Anxiety    Arthritis    Back pain    Depression    Diverticulosis    on CT   Gallstones    GERD (gastroesophageal reflux disease)    Hypertension    pt lost weight and went away   Migraine    Morbid obesity (HCC)    Multiple gastric ulcers    Pancreatitis    Pre-diabetes     Past Surgical History:  Procedure Laterality Date   ABLATION     BARIATRIC SURGERY  2014   Sleeve Gsatrectomy   CHOLECYSTECTOMY  2000   ECTOPIC PREGNANCY SURGERY     twice, with removal of 1 tube 1997 and 1998   ESOPHAGOGASTRODUODENOSCOPY     August 2024 for foreign body   HYSTEROSCOPY WITH NOVASURE N/A 03/06/2021   Procedure: HYSTEROSCOPY; DILATATION AND CURETTAGE WIITH NOVASURE ABLATION;  Surgeon: Jerene Bears, MD;  Location: [facility] [place];  Service: Gynecology;  Laterality: N/A;   TUBAL LIGATION      Social History   Socioeconomic History   Marital status: Married    Spouse name: Not on file   Number of children: 3   Years of education: Not on file    Highest education level: Not on file  Occupational History   Occupation: insurance billing  Tobacco Use   Smoking status: Never   Smokeless tobacco: Never  Vaping Use   Vaping status: Never Used  Substance and Sexual Activity   Alcohol use: Yes    Comment: occasionally   Drug use: No   Sexual activity: Yes    Partners: Male    Birth control/protection: Surgical  Other Topics Concern   Not on file  Social History Narrative   Not on file   Social Determinants of Health   Financial Resource Strain: Not on file  Food Insecurity: Not on file  Transportation Needs: No Transportation Needs (12/03/2022)   PRAPARE - Administrator, Civil Service (Medical): No    Lack of Transportation (Non-Medical): No  Physical Activity: Not on file  Stress: Not on file  Social Connections: Unknown (08/24/2021)   Received from [facility]   Social Network    Social Network: Not on file    Family History  Problem Relation Age of Onset   Bladder Cancer Mother    Irregular heart beat Mother    Asthma Father    Breast cancer Maternal Grandmother    Diabetes Paternal Grandfather  Ovarian cancer Maternal Aunt     Health Maintenance  Topic Date Due   Colonoscopy  Never done   COVID-19 Vaccine (3 - 2023-24 season) 02/28/2023 (Originally 12/21/2022)   Cervical Cancer Screening (HPV/Pap Cotest)  11/01/2023   DTaP/Tdap/Td (3 - Td or Tdap) 09/15/2025   INFLUENZA VACCINE  Completed   Hepatitis C Screening  Completed   HIV Screening  Completed   HPV VACCINES  Aged Out     ----------------------------------------------------------------------------------------------------------------------------------------------------------------------------------------------------------------- Physical Exam BP 107/65 (BP Location: Left Arm, Patient Position: Sitting, Cuff Size: Large)   Pulse 67   Ht 5\' 3"  (1.6 m)   Wt 214 lb (97.1 kg)   SpO2 100%   BMI 37.91 kg/m   Physical  Exam Constitutional:      Appearance: Normal appearance.  Eyes:     General: No scleral icterus. Musculoskeletal:     Cervical back: Neck supple.  Neurological:     Mental Status: She is alert.  Psychiatric:        Mood and Affect: Mood normal.        Behavior: Behavior normal.     ------------------------------------------------------------------------------------------------------------------------------------------------------------------------------------------------------------------- Assessment and Plan  Attention deficit hyperactivity disorder (ADHD), inattentive type, moderate Restarting adderall xr 30mg  daily.    GAD (generalized anxiety disorder) She has been our of fluoxetine recently.  Adding this back on and recommend continuation of bupropion at current strength.    Hypertension She is taking combination of olmesartan and metoprolol.  Doing well with these at this time. She would like for me to take over medications. Recommend continuation.  Insomnia Trial of doxepin.  Recommend magnesium supplementation as well.    Meds ordered this encounter  Medications   amphetamine-dextroamphetamine (ADDERALL XR) 30 MG 24 hr capsule    Sig: Take 1 capsule (30 mg total) by mouth every morning.    Dispense:  90 capsule    Refill:  0   buPROPion (WELLBUTRIN XL) 300 MG 24 hr tablet    Sig: Take 1 tablet (300 mg total) by mouth daily.    Dispense:  90 tablet    Refill:  3   FLUoxetine (PROZAC) 20 MG capsule    Sig: Take 1 capsule (20 mg total) by mouth daily.    Dispense:  90 capsule    Refill:  3   doxepin (SINEQUAN) 10 MG capsule    Sig: Take 1-2 capsules (10-20 mg total) by mouth at bedtime as needed.    Dispense:  90 capsule    Refill:  1   chlorthalidone (HYGROTON) 25 MG tablet    Sig: Take 1 tablet (25 mg total) by mouth daily. Specialized dosing    Dispense:  90 tablet    Refill:  3   olmesartan (BENICAR) 20 MG tablet    Sig: Take 2 tablets (40 mg total) by  mouth daily.    Dispense:  90 tablet    Refill:  3   metoprolol succinate (TOPROL-XL) 50 MG 24 hr tablet    Sig: TAKE 1 TABLET(50 MG) BY MOUTH DAILY WITH OR IMMEDIATELY FOLLOWING A MEAL    Dispense:  90 tablet    Refill:  3    Return in about 2 months (around 04/14/2023) for Annual Exam.    This visit occurred during the SARS-CoV-2 public health emergency.  Safety protocols were in place, including screening questions prior to the visit, additional usage of staff PPE, and extensive cleaning of exam room while observing appropriate contact time as indicated for disinfecting solutions.

## 2023-02-12 NOTE — Assessment & Plan Note (Signed)
Trial of doxepin.  Recommend magnesium supplementation as well.

## 2023-02-12 NOTE — Assessment & Plan Note (Signed)
Restarting adderall xr 30mg  daily.

## 2023-02-12 NOTE — Assessment & Plan Note (Signed)
She has been our of fluoxetine recently.  Adding this back on and recommend continuation of bupropion at current strength.

## 2023-02-12 NOTE — Patient Instructions (Signed)
Try magnesium glycinate 250-500mg  to help with sleep and anxiety.

## 2023-02-12 NOTE — Assessment & Plan Note (Signed)
She is taking combination of olmesartan and metoprolol.  Doing well with these at this time. She would like for me to take over medications. Recommend continuation.

## 2023-02-13 ENCOUNTER — Other Ambulatory Visit (HOSPITAL_COMMUNITY): Payer: Self-pay

## 2023-02-13 ENCOUNTER — Other Ambulatory Visit: Payer: Self-pay | Admitting: Family Medicine

## 2023-02-16 ENCOUNTER — Other Ambulatory Visit: Payer: Self-pay

## 2023-02-25 ENCOUNTER — Other Ambulatory Visit: Payer: Self-pay | Admitting: Physician Assistant

## 2023-02-25 DIAGNOSIS — Z1231 Encounter for screening mammogram for malignant neoplasm of breast: Secondary | ICD-10-CM

## 2023-02-26 ENCOUNTER — Other Ambulatory Visit (HOSPITAL_COMMUNITY): Payer: Self-pay

## 2023-02-27 ENCOUNTER — Encounter: Payer: Self-pay | Admitting: Family Medicine

## 2023-02-27 DIAGNOSIS — F32A Depression, unspecified: Secondary | ICD-10-CM

## 2023-02-27 DIAGNOSIS — F411 Generalized anxiety disorder: Secondary | ICD-10-CM

## 2023-03-10 ENCOUNTER — Other Ambulatory Visit (HOSPITAL_COMMUNITY): Payer: Self-pay

## 2023-03-26 ENCOUNTER — Ambulatory Visit
Admission: RE | Admit: 2023-03-26 | Discharge: 2023-03-26 | Disposition: A | Discharge status: Home / Self Care | Payer: Managed Care, Other (non HMO) | Source: Ambulatory Visit | Attending: Physician Assistant | Admitting: Physician Assistant

## 2023-03-26 DIAGNOSIS — Z1231 Encounter for screening mammogram for malignant neoplasm of breast: Secondary | ICD-10-CM

## 2023-04-02 ENCOUNTER — Other Ambulatory Visit: Payer: Self-pay

## 2023-04-16 ENCOUNTER — Ambulatory Visit (INDEPENDENT_AMBULATORY_CARE_PROVIDER_SITE_OTHER): Payer: Managed Care, Other (non HMO) | Admitting: Family Medicine

## 2023-04-16 ENCOUNTER — Encounter: Payer: Self-pay | Admitting: Family Medicine

## 2023-04-16 VITALS — BP 135/79 | HR 65 | Ht 63.0 in | Wt 213.0 lb

## 2023-04-16 DIAGNOSIS — I10 Essential (primary) hypertension: Secondary | ICD-10-CM

## 2023-04-16 DIAGNOSIS — E785 Hyperlipidemia, unspecified: Secondary | ICD-10-CM | POA: Diagnosis not present

## 2023-04-16 DIAGNOSIS — Z903 Acquired absence of stomach [part of]: Secondary | ICD-10-CM

## 2023-04-16 DIAGNOSIS — Z0000 Encounter for general adult medical examination without abnormal findings: Secondary | ICD-10-CM | POA: Diagnosis not present

## 2023-04-16 NOTE — Assessment & Plan Note (Addendum)
Well adult Orders Placed This Encounter  Procedures   CMP14+EGFR   CBC with Differential/Platelet   Lipid Panel With LDL/HDL Ratio   Vitamin D (25 hydroxy)   B12   Iron, TIBC and Ferritin Panel   Zinc  Screenings: Per lab orders.  Immunizations:  UTD Anticipatory guidance/Risk factor reduction:  Recommendations per AVS.

## 2023-04-16 NOTE — Patient Instructions (Signed)
Preventive Care 40-46 Years Old, Female Preventive care refers to lifestyle choices and visits with your health care provider that can promote health and wellness. Preventive care visits are also called wellness exams. What can I expect for my preventive care visit? Counseling Your health care provider may ask you questions about your: Medical history, including: Past medical problems. Family medical history. Pregnancy history. Current health, including: Menstrual cycle. Method of birth control. Emotional well-being. Home life and relationship well-being. Sexual activity and sexual health. Lifestyle, including: Alcohol, nicotine or tobacco, and drug use. Access to firearms. Diet, exercise, and sleep habits. Work and work environment. Sunscreen use. Safety issues such as seatbelt and bike helmet use. Physical exam Your health care provider will check your: Height and weight. These may be used to calculate your BMI (body mass index). BMI is a measurement that tells if you are at a healthy weight. Waist circumference. This measures the distance around your waistline. This measurement also tells if you are at a healthy weight and may help predict your risk of certain diseases, such as type 2 diabetes and high blood pressure. Heart rate and blood pressure. Body temperature. Skin for abnormal spots. What immunizations do I need?  Vaccines are usually given at various ages, according to a schedule. Your health care provider will recommend vaccines for you based on your age, medical history, and lifestyle or other factors, such as travel or where you work. What tests do I need? Screening Your health care provider may recommend screening tests for certain conditions. This may include: Lipid and cholesterol levels. Diabetes screening. This is done by checking your blood sugar (glucose) after you have not eaten for a while (fasting). Pelvic exam and Pap test. Hepatitis B test. Hepatitis C  test. HIV (human immunodeficiency virus) test. STI (sexually transmitted infection) testing, if you are at risk. Lung cancer screening. Colorectal cancer screening. Mammogram. Talk with your health care provider about when you should start having regular mammograms. This may depend on whether you have a family history of breast cancer. BRCA-related cancer screening. This may be done if you have a family history of breast, ovarian, tubal, or peritoneal cancers. Bone density scan. This is done to screen for osteoporosis. Talk with your health care provider about your test results, treatment options, and if necessary, the need for more tests. Follow these instructions at home: Eating and drinking  Eat a diet that includes fresh fruits and vegetables, whole grains, lean protein, and low-fat dairy products. Take vitamin and mineral supplements as recommended by your health care provider. Do not drink alcohol if: Your health care provider tells you not to drink. You are pregnant, may be pregnant, or are planning to become pregnant. If you drink alcohol: Limit how much you have to 0-1 drink a day. Know how much alcohol is in your drink. In the [place] one drink equals one 12 oz bottle of beer (355 mL), one 5 oz glass of wine (148 mL), or one 1 oz glass of hard liquor (44 mL). Lifestyle Brush your teeth every morning and night with fluoride toothpaste. Floss one time each day. Exercise for at least 30 minutes 5 or more days each week. Do not use any products that contain nicotine or tobacco. These products include cigarettes, chewing tobacco, and vaping devices, such as e-cigarettes. If you need help quitting, ask your health care provider. Do not use drugs. If you are sexually active, practice safe sex. Use a condom or other form of protection to   prevent STIs. If you do not wish to become pregnant, use a form of birth control. If you plan to become pregnant, see your health care provider for a  prepregnancy visit. Take aspirin only as told by your health care provider. Make sure that you understand how much to take and what form to take. Work with your health care provider to find out whether it is safe and beneficial for you to take aspirin daily. Find healthy ways to manage stress, such as: Meditation, yoga, or listening to music. Journaling. Talking to a trusted person. Spending time with friends and family. Minimize exposure to UV radiation to reduce your risk of skin cancer. Safety Always wear your seat belt while driving or riding in a vehicle. Do not drive: If you have been drinking alcohol. Do not ride with someone who has been drinking. When you are tired or distracted. While texting. If you have been using any mind-altering substances or drugs. Wear a helmet and other protective equipment during sports activities. If you have firearms in your house, make sure you follow all gun safety procedures. Seek help if you have been physically or sexually abused. What's next? Visit your health care provider once a year for an annual wellness visit. Ask your health care provider how often you should have your eyes and teeth checked. Stay up to date on all vaccines. This information is not intended to replace advice given to you by your health care provider. Make sure you discuss any questions you have with your health care provider. Document Revised: 10/03/2020 Document Reviewed: 10/03/2020 Elsevier Patient Education  2024 [employer]  

## 2023-04-16 NOTE — Progress Notes (Signed)
[name] - 47 y.o. female MRN [id number]  Date of birth: [birth year]  Subjective Chief Complaint  Patient presents with  . Annual Exam    HPI Natalie Hopkins is a 47 y.o. female here today for annual exam.   Reports that she is doing well    She reports that activity level is pretty good.  Diet is fairly good, could make some improvements with this.   She is a non-smoker. Occasional EtOH use/  Colonoscopy in 2023, repeat in 10 years.   Review of Systems  Constitutional:  Negative for chills, fever, malaise/fatigue and weight loss.  HENT:  Negative for congestion, ear pain and sore throat.   Eyes:  Negative for blurred vision, double vision and pain.  Respiratory:  Negative for cough and shortness of breath.   Cardiovascular:  Negative for chest pain and palpitations.  Gastrointestinal:  Negative for abdominal pain, blood in stool, constipation, heartburn and nausea.  Genitourinary:  Negative for dysuria and urgency.  Musculoskeletal:  Negative for joint pain and myalgias.  Neurological:  Negative for dizziness and headaches.  Endo/Heme/Allergies:  Does not bruise/bleed easily.  Psychiatric/Behavioral:  Negative for depression. The patient is not nervous/anxious and does not have insomnia.     No Known Allergies  Past Medical History:  Diagnosis Date  . ADHD (attention deficit hyperactivity disorder)    takes vyvanse  . Anal fissure   . Anemia    comes and go  . Anxiety   . Arthritis   . Back pain   . Depression   . Diverticulosis    on CT  . Gallstones   . GERD (gastroesophageal reflux disease)   . Hypertension    pt lost weight and went away  . Migraine   . Morbid obesity (HCC)   . Multiple gastric ulcers   . Pancreatitis   . Pre-diabetes     Past Surgical History:  Procedure Laterality Date  . ABLATION    . 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 CHOLECYSTECTOMY  2000  . ECTOPIC PREGNANCY SURGERY     twice, with removal of 1 tube 1997 and  1998  . ESOPHAGOGASTRODUODENOSCOPY     August 2024 for foreign body  . HYSTEROSCOPY WITH NOVASURE N/A 03/06/2021   Procedure: HYSTEROSCOPY; DILATATION AND CURETTAGE WIITH NOVASURE ABLATION;  Surgeon: Jerene Bears, MD;  Location: [facility] [place];  Service: Gynecology;  Laterality: N/A;  . TUBAL LIGATION      Social History   Socioeconomic History  . Marital status: Married    Spouse name: Not on file  . Number of children: 3  . Years of education: Not on file  . Highest education level: Not on file  Occupational History  . Occupation: Education officer, environmental  Tobacco Use  . Smoking status: Never  . Smokeless tobacco: Never  Vaping Use  . Vaping status: Never Used  Substance and Sexual Activity  . Alcohol use: Yes    Comment: occasionally  . Drug use: No  . Sexual activity: Yes    Partners: Male    Birth control/protection: Surgical  Other Topics Concern  . Not on file  Social History Narrative  . Not on file   Social Drivers of Health   Financial Resource Strain: Not on file  Food Insecurity: Low Risk  (02/25/2023)   Received from [facility]   Hunger Vital Sign   . Worried About Programme researcher, broadcasting/film/video in the Last Year: Never true   .  Ran Out of Food in the Last Year: Never true  Transportation Needs: No Transportation Needs (02/25/2023)   Received from [facility]   . In the past 12 months, has lack of reliable transportation kept you from medical appointments, meetings, work or from getting things needed for daily living? : No  Physical Activity: Not on file  Stress: Not on file  Social Connections: Unknown (08/24/2021)   Received from [facility]   Social Network   . Social Network: Not on file    Family History  Problem Relation Age of Onset  . Bladder Cancer Mother   . Irregular heart beat Mother   . Asthma Father   . Breast cancer Maternal Grandmother   . Diabetes Paternal Grandfather   . Ovarian cancer Maternal  Aunt     Health Maintenance  Topic Date Due  . Colonoscopy  Never done  . COVID-19 Vaccine (3 - 2024-25 season) 07/20/2023 (Originally 12/21/2022)  . Cervical Cancer Screening (HPV/Pap Cotest)  11/01/2023  . DTaP/Tdap/Td (3 - Td or Tdap) 09/15/2025  . INFLUENZA VACCINE  Completed  . Hepatitis C Screening  Completed  . HIV Screening  Completed  . HPV VACCINES  Aged Out     ----------------------------------------------------------------------------------------------------------------------------------------------------------------------------------------------------------------- Physical Exam BP 135/79   Pulse 65   Ht 5\' 3"  (1.6 m)   Wt 213 lb (96.6 kg)   SpO2 98%   BMI 37.73 kg/m   Physical Exam Constitutional:      General: She is not in acute distress. HENT:     Head: Normocephalic and atraumatic.     Right Ear: Tympanic membrane and ear canal normal.     Left Ear: Tympanic membrane and ear canal normal.     Nose: Nose normal.  Eyes:     General: No scleral icterus.    Conjunctiva/sclera: Conjunctivae normal.  Neck:     Thyroid: No thyromegaly.  Cardiovascular:     Rate and Rhythm: Normal rate and regular rhythm.     Heart sounds: Normal heart sounds.  Pulmonary:     Effort: Pulmonary effort is normal.     Breath sounds: Normal breath sounds.  Abdominal:     General: Bowel sounds are normal. There is no distension.     Palpations: Abdomen is soft.     Tenderness: There is no abdominal tenderness. There is no guarding.  Musculoskeletal:        General: Normal range of motion.     Cervical back: Normal range of motion and neck supple.  Lymphadenopathy:     Cervical: No cervical adenopathy.  Skin:    General: Skin is warm and dry.     Findings: No rash.  Neurological:     General: No focal deficit present.     Mental Status: She is alert and oriented to person, place, and time.     Cranial Nerves: No cranial nerve deficit.     Coordination: Coordination  normal.  Psychiatric:        Mood and Affect: Mood normal.        Behavior: Behavior normal.    ------------------------------------------------------------------------------------------------------------------------------------------------------------------------------------------------------------------- Assessment and Plan  Well adult exam Well adult Orders Placed This Encounter  Procedures  . CMP14+EGFR  . CBC with Differential/Platelet  . Lipid Panel With LDL/HDL Ratio  . Vitamin D (25 hydroxy)  . B12  . Iron, TIBC and Ferritin Panel  . Zinc  Screenings: Per lab orders.  Immunizations:  UTD Anticipatory guidance/Risk factor reduction:  Recommendations per AVS.    No orders of the defined types were placed in this encounter.   No follow-ups on file.    This visit occurred during the SARS-CoV-2 public health emergency.  Safety protocols were in place, including screening questions prior to the visit, additional usage of staff PPE, and extensive cleaning of exam room while observing appropriate contact time as indicated for disinfecting solutions.

## 2023-04-17 LAB — LIPID PANEL WITH LDL/HDL RATIO
Cholesterol, Total: 188 mg/dL (ref 100–199)
HDL: 53 mg/dL (ref >39)
LDL Chol Calc (NIH): 110 mg/dL — ABNORMAL HIGH (ref 0–99)
LDL/HDL Ratio: 2.1 {ratio} (ref 0.0–3.2)
Triglycerides: 143 mg/dL (ref 0–149)
VLDL Cholesterol Cal: 25 mg/dL (ref 5–40)

## 2023-04-17 LAB — CBC WITH DIFFERENTIAL/PLATELET
Basophils Absolute: 0 10*3/uL (ref 0.0–0.2)
Basos: 1 %
EOS (ABSOLUTE): 0.1 10*3/uL (ref 0.0–0.4)
Eos: 1 %
Hematocrit: 44.2 % (ref 34.0–46.6)
Hemoglobin: 14.5 g/dL (ref 11.1–15.9)
Immature Grans (Abs): 0 10*3/uL (ref 0.0–0.1)
Immature Granulocytes: 0 %
Lymphocytes Absolute: 1.4 10*3/uL (ref 0.7–3.1)
Lymphs: 27 %
MCH: 30.7 pg (ref 26.6–33.0)
MCHC: 32.8 g/dL (ref 31.5–35.7)
MCV: 94 fL (ref 79–97)
Monocytes Absolute: 0.5 10*3/uL (ref 0.1–0.9)
Monocytes: 9 %
Neutrophils Absolute: 3.3 10*3/uL (ref 1.4–7.0)
Neutrophils: 62 %
Platelets: 212 10*3/uL (ref 150–450)
RBC: 4.72 x10E6/uL (ref 3.77–5.28)
RDW: 11.9 % (ref 11.7–15.4)
WBC: 5.3 10*3/uL (ref 3.4–10.8)

## 2023-04-17 LAB — CMP14+EGFR
ALT: 19 [IU]/L (ref 0–32)
AST: 18 [IU]/L (ref 0–40)
Albumin: 4.1 g/dL (ref 3.9–4.9)
Alkaline Phosphatase: 86 [IU]/L (ref 44–121)
BUN/Creatinine Ratio: 27 — ABNORMAL HIGH (ref 9–23)
BUN: 23 mg/dL (ref 6–24)
Bilirubin Total: 0.7 mg/dL (ref 0.0–1.2)
CO2: 25 mmol/L (ref 20–29)
Calcium: 9.3 mg/dL (ref 8.7–10.2)
Chloride: 102 mmol/L (ref 96–106)
Creatinine, Ser: 0.84 mg/dL (ref 0.57–1.00)
Globulin, Total: 2.7 g/dL (ref 1.5–4.5)
Glucose: 94 mg/dL (ref 70–99)
Potassium: 4.4 mmol/L (ref 3.5–5.2)
Sodium: 138 mmol/L (ref 134–144)
Total Protein: 6.8 g/dL (ref 6.0–8.5)
eGFR: 87 mL/min/{1.73_m2} (ref >59)

## 2023-04-17 LAB — ZINC: Zinc: 66 ug/dL (ref 44–115)

## 2023-04-17 LAB — IRON,TIBC AND FERRITIN PANEL
Ferritin: 95 ng/mL (ref 15–150)
Iron Saturation: 40 % (ref 15–55)
Iron: 141 ug/dL (ref 27–159)
Total Iron Binding Capacity: 353 ug/dL (ref 250–450)
UIBC: 212 ug/dL (ref 131–425)

## 2023-04-17 LAB — VITAMIN B12: Vitamin B-12: 675 pg/mL (ref 232–1245)

## 2023-04-17 LAB — VITAMIN D 25 HYDROXY (VIT D DEFICIENCY, FRACTURES): Vit D, 25-Hydroxy: 31.5 ng/mL (ref 30.0–100.0)

## 2023-05-04 IMAGING — MG MM DIGITAL SCREENING BILAT W/ TOMO AND CAD
8 series · 8 of 24 positions shown · non-contrast
Comparison: None.

CLINICAL DATA: Screening.

EXAM:
DIGITAL SCREENING BILATERAL MAMMOGRAM WITH TOMOSYNTHESIS AND CAD
TECHNIQUE: Bilateral screening digital craniocaudal and mediolateral oblique
mammograms were obtained. Bilateral screening digital breast
tomosynthesis was performed. The images were evaluated with
computer-aided detection.

[R CC synth-2D]
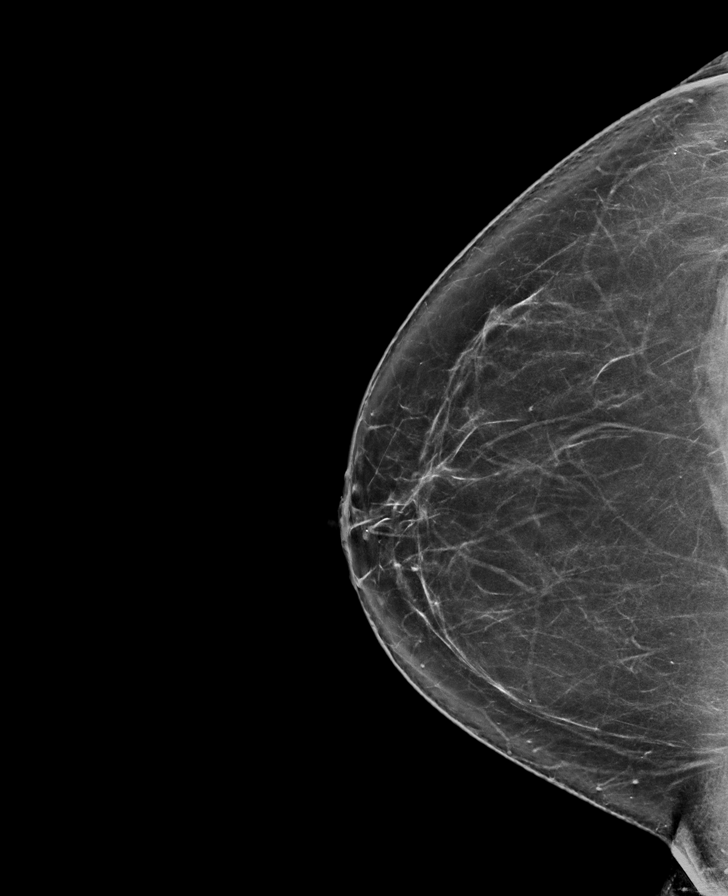

[L CC synth-2D]
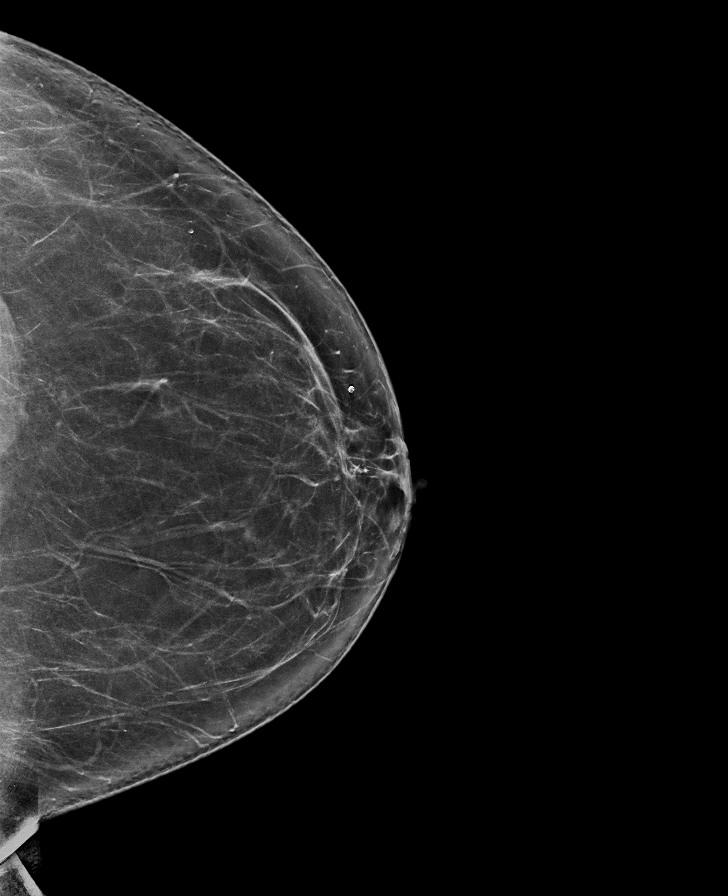

[L MLO synth-2D]
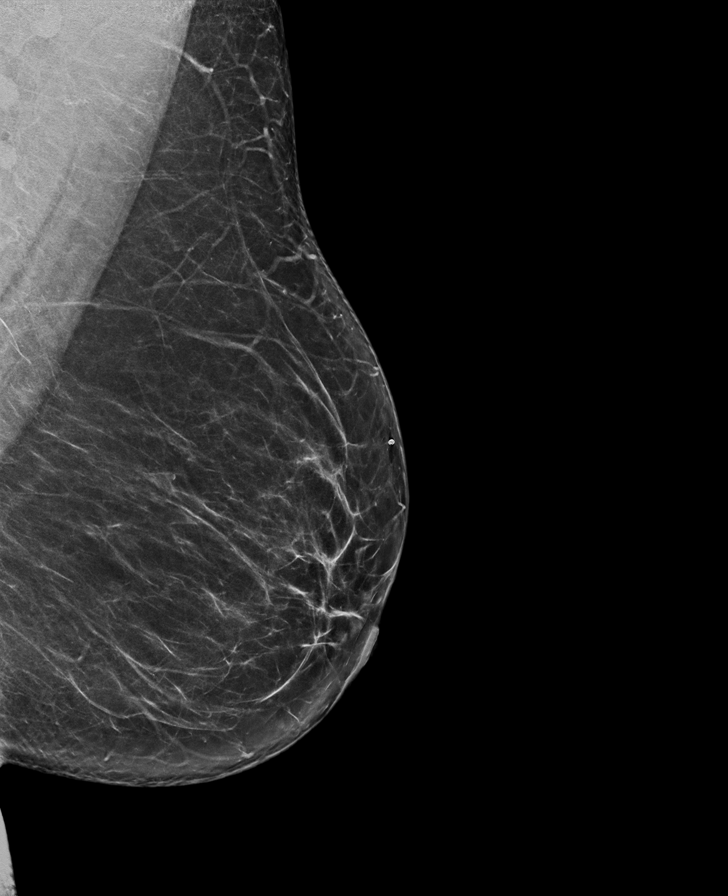

[R MLO synth-2D]
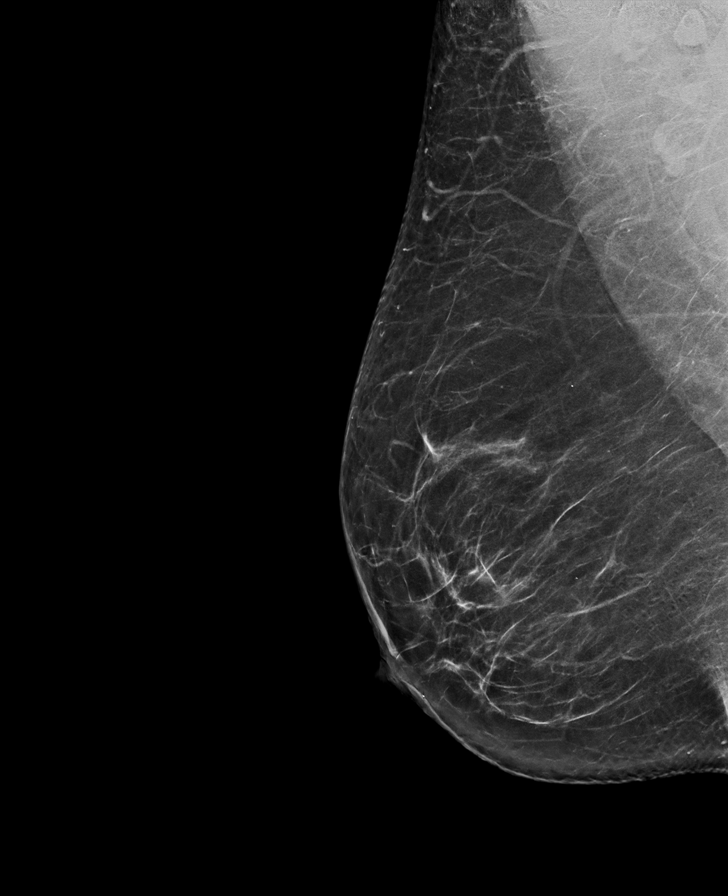

[R CC tomo · tomo slice 43/84.0]
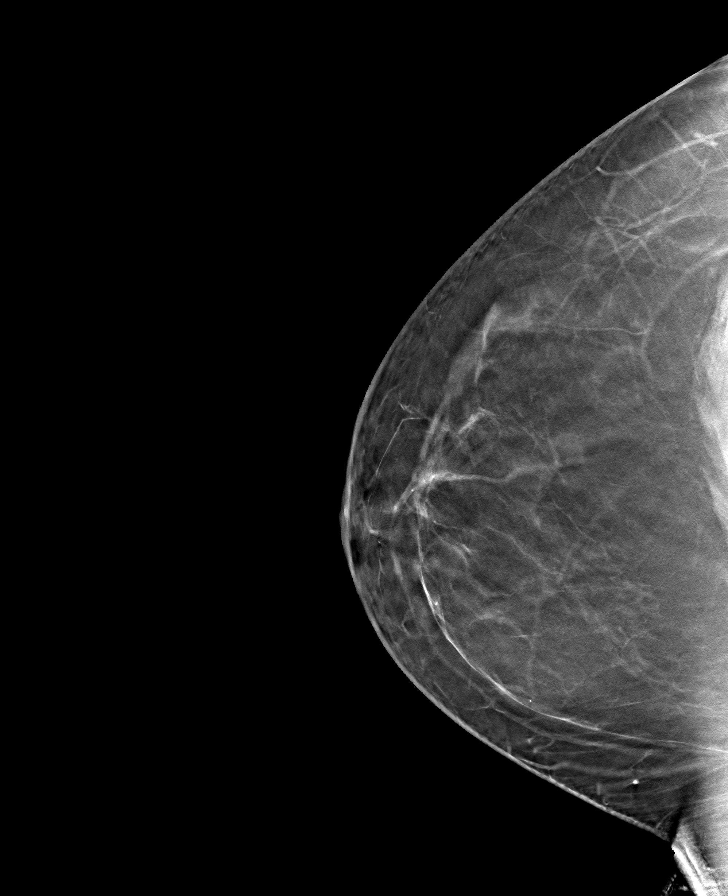

[L MLO tomo · tomo slice 41/82.0]
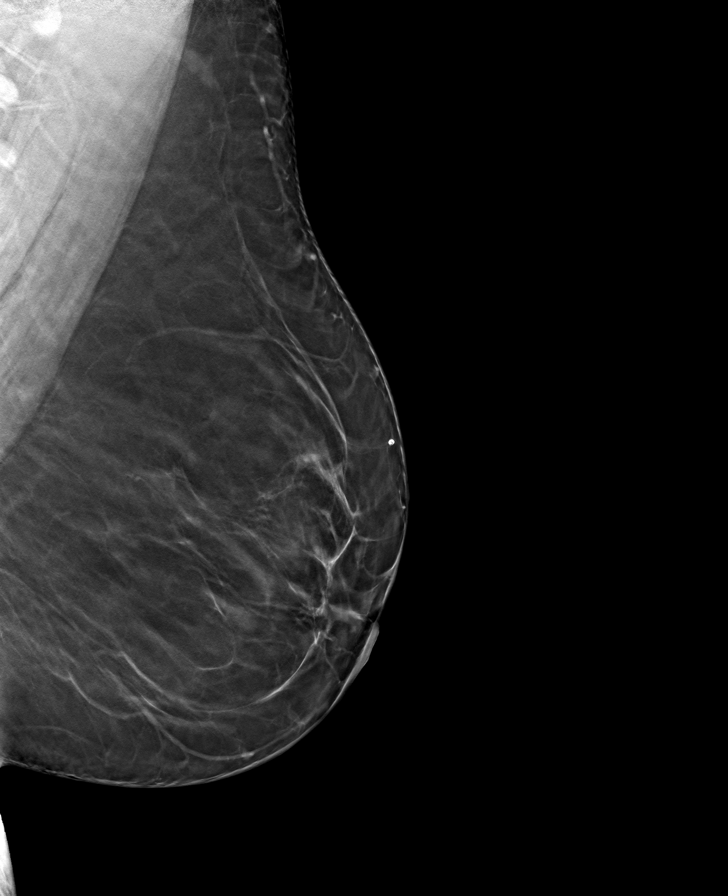

[R MLO tomo · tomo slice 43/86.0]
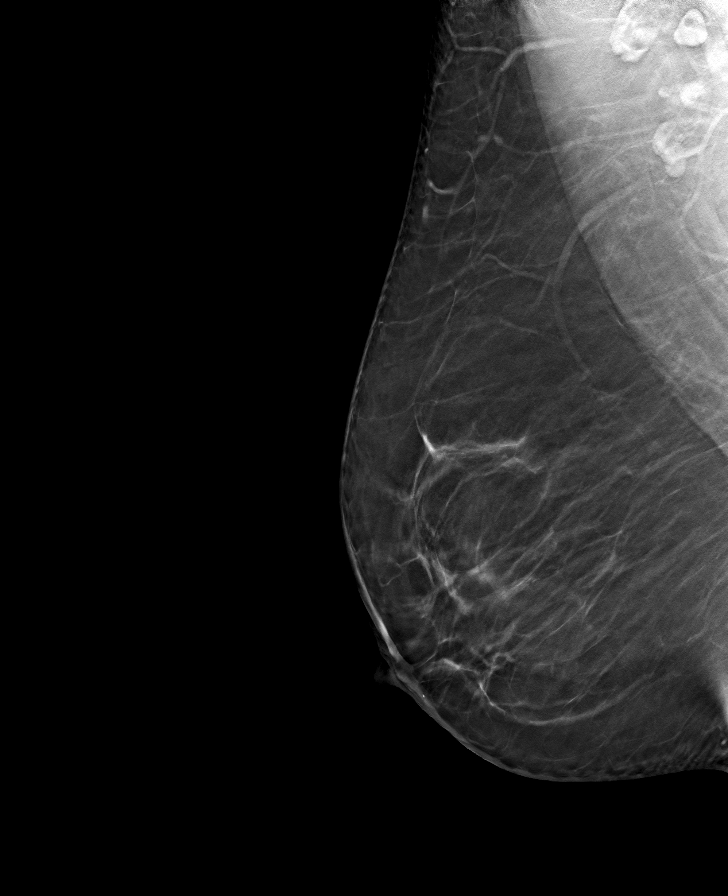

[L CC tomo · tomo slice 41/81.0]
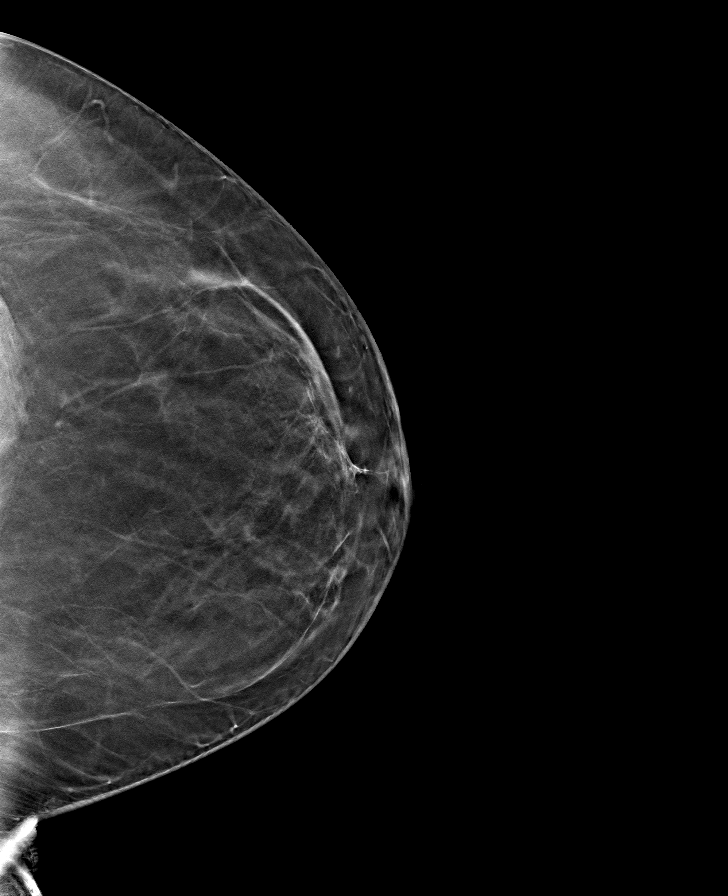

[8 of 24 positions shown; findings below may reference images not displayed]

ACR Breast Density Category b: There are scattered areas of
fibroglandular density.
FINDINGS: There are no findings suspicious for malignancy.
IMPRESSION: No mammographic evidence of malignancy. A result letter of this
screening mammogram will be mailed directly to the patient.

RECOMMENDATION:
Screening mammogram in one year. (Code:XG-X-X7B)

BI-RADS CATEGORY  1: Negative.

## 2023-06-02 ENCOUNTER — Other Ambulatory Visit: Payer: Self-pay | Admitting: Family Medicine

## 2023-06-02 ENCOUNTER — Other Ambulatory Visit (HOSPITAL_COMMUNITY): Payer: Self-pay

## 2023-06-03 ENCOUNTER — Other Ambulatory Visit: Payer: Self-pay

## 2023-06-03 ENCOUNTER — Other Ambulatory Visit (HOSPITAL_COMMUNITY): Payer: Self-pay

## 2023-06-03 MED ORDER — AMPHETAMINE-DEXTROAMPHET ER 30 MG PO CP24
30.0000 mg | ORAL_CAPSULE | ORAL | 0 refills | Status: DC
  Filled 2023-06-03: qty 90, 90d supply, fill #0

## 2023-06-04 ENCOUNTER — Other Ambulatory Visit: Payer: Self-pay

## 2023-06-05 ENCOUNTER — Encounter: Payer: Self-pay | Admitting: Family Medicine

## 2023-06-05 ENCOUNTER — Encounter (HOSPITAL_BASED_OUTPATIENT_CLINIC_OR_DEPARTMENT_OTHER): Payer: Self-pay | Admitting: Obstetrics & Gynecology

## 2023-06-06 ENCOUNTER — Encounter (HOSPITAL_COMMUNITY): Payer: Self-pay | Admitting: Psychiatry

## 2023-06-06 ENCOUNTER — Ambulatory Visit (HOSPITAL_COMMUNITY): Payer: 59 | Admitting: Psychiatry

## 2023-06-06 ENCOUNTER — Other Ambulatory Visit (HOSPITAL_COMMUNITY): Payer: Self-pay

## 2023-06-06 DIAGNOSIS — F411 Generalized anxiety disorder: Secondary | ICD-10-CM

## 2023-06-06 DIAGNOSIS — F331 Major depressive disorder, recurrent, moderate: Secondary | ICD-10-CM | POA: Diagnosis not present

## 2023-06-06 DIAGNOSIS — F900 Attention-deficit hyperactivity disorder, predominantly inattentive type: Secondary | ICD-10-CM

## 2023-06-06 MED ORDER — FLUOXETINE HCL 10 MG PO CAPS
10.0000 mg | ORAL_CAPSULE | Freq: Every day | ORAL | 0 refills | Status: DC
  Filled 2023-06-06: qty 30, 30d supply, fill #0
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
## 2023-06-16 ENCOUNTER — Other Ambulatory Visit (HOSPITAL_COMMUNITY): Payer: Self-pay

## 2023-07-06 ENCOUNTER — Telehealth (HOSPITAL_COMMUNITY): Payer: 59 | Admitting: Psychiatry

## 2023-07-06 ENCOUNTER — Encounter (HOSPITAL_COMMUNITY): Payer: Self-pay

## 2023-07-07 ENCOUNTER — Encounter (HOSPITAL_COMMUNITY): Payer: Self-pay | Admitting: Psychiatry

## 2023-07-07 ENCOUNTER — Other Ambulatory Visit (HOSPITAL_COMMUNITY): Payer: Self-pay

## 2023-07-07 ENCOUNTER — Telehealth (INDEPENDENT_AMBULATORY_CARE_PROVIDER_SITE_OTHER): Admitting: Psychiatry

## 2023-07-07 DIAGNOSIS — F411 Generalized anxiety disorder: Secondary | ICD-10-CM

## 2023-07-07 DIAGNOSIS — F331 Major depressive disorder, recurrent, moderate: Secondary | ICD-10-CM | POA: Diagnosis not present

## 2023-07-07 DIAGNOSIS — F900 Attention-deficit hyperactivity disorder, predominantly inattentive type: Secondary | ICD-10-CM | POA: Diagnosis not present

## 2023-07-07 MED ORDER — FLUOXETINE HCL 10 MG PO CAPS
10.0000 mg | ORAL_CAPSULE | Freq: Every day | ORAL | 0 refills | Status: DC
  Filled 2023-07-07: qty 30, 30d supply, fill #0

## 2023-07-07 NOTE — Progress Notes (Signed)
 Psychiatric Initial Adult Assessment   Patient Identification: Natalie Hopkins MRN:  [id number] Date of Evaluation:  07/07/2023 Referral Source: primary care. Dr. Selena Batten Chief Complaint:   No chief complaint on file. Follow up depression, anxiety Visit Diagnosis:    ICD-10-CM   1. MDD (major depressive disorder), recurrent episode, moderate (HCC)  F33.1     2. GAD (generalized anxiety disorder)  F41.1     3. Attention deficit hyperactivity disorder (ADHD), inattentive type, moderate  F90.0     Virtual Visit via Video Note  I connected with Natalie Hopkins on 07/07/23 at 10:30 AM EDT by a video enabled telemedicine application and verified that I am speaking with the correct person using two identifiers.  Location: Patient: parked car Provider: office   I discussed the limitations of evaluation and management by telemedicine and the availability of in person appointments. The patient expressed understanding and agreed to proceed.    I discussed the assessment and treatment plan with the patient. The patient was provided an opportunity to ask questions and all were answered. The patient agreed with the plan and demonstrated an understanding of the instructions.   The patient was advised to call back or seek an in-person evaluation if the symptoms worsen or if the condition fails to improve as anticipated.  I provided 20 minutes of non-face-to-face time during this encounter.     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  Patient has been married for more than 14 years and recently separated referred initially by primary care physician establish care with history of depression, anxiety and ADHD  Last visit reviewed history and depression recently exacerbated by seperation also having job stress We increased prozac by 10mg  but she forgot to pick that up  and has kept it 20mg   She is endorsing stress, subdued mood due to her job stress and planning to look around for change Understands stimulant med for adhd can contribute to mood/anxiety as well Is scheduled for therapy   Current meds include  Wellbutrin 300, Prozac 20 mg, Adderall 30 mg  Aggravating factors; recent seperation, financial issues lost her job and then got another job  Modifying factors; kids, her grandkids a special friend she also plays billiard report and also goes for hike  Duration since age 27  Bariatric surgery 2014 at that time she was 300 pounds now her weight is 210 self-reported also height 5 foot 2 inches     Past Psychiatric History: depression, anxiety, adhd, questionable bipolar  Previous Psychotropic Medications: Yes   Substance Abuse History in the last 12 months:  No.  Consequences of Substance Abuse: NA  Past Medical History:  Past Medical History:  Diagnosis Date   ADHD (attention deficit hyperactivity disorder)    takes vyvanse   Anal fissure    Anemia    comes and go   Anxiety    Arthritis    Back pain    Depression    Diverticulosis    on CT   Gallstones    GERD (gastroesophageal reflux disease)    Hypertension    pt lost weight and went away   Migraine    Morbid obesity (HCC)    Multiple gastric ulcers    Pancreatitis    Pre-diabetes     Past Surgical History:  Procedure Laterality Date   ABLATION     BARIATRIC  SURGERY  2014   Sleeve Gsatrectomy   CHOLECYSTECTOMY  2000   ECTOPIC PREGNANCY SURGERY     twice, with removal of 1 tube 1997 and 1998   ESOPHAGOGASTRODUODENOSCOPY     August 2024 for foreign body   HYSTEROSCOPY WITH NOVASURE N/A 03/06/2021   Procedure: HYSTEROSCOPY; DILATATION AND CURETTAGE WIITH NOVASURE ABLATION;  Surgeon: Jerene Bears, MD;  Location: [facility];  Service: Gynecology;  Laterality: N/A;   TUBAL LIGATION      Family Psychiatric History: dad : bipolar or schizophrenia,  cocaine use  Family History:  Family History  Problem Relation Age of Onset   Bladder Cancer Mother    Irregular heart beat Mother    Asthma Father    Breast cancer Maternal Grandmother    Diabetes Paternal Grandfather    Ovarian cancer Maternal Aunt     Social History:   Social History   Socioeconomic History   Marital status: Married    Spouse name: Not on file   Number of children: 3   Years of education: Not on file   Highest education level: Not on file  Occupational History   Occupation: insurance billing  Tobacco Use   Smoking status: Never   Smokeless tobacco: Never  Vaping Use   Vaping status: Never Used  Substance and Sexual Activity   Alcohol use: Yes    Comment: occasionally   Drug use: No   Sexual activity: Yes    Partners: Male    Birth control/protection: Surgical  Other Topics Concern   Not on file  Social History Narrative   Not on file   Social Drivers of Health   Financial Resource Strain: Not on file  Food Insecurity: Low Risk  (02/25/2023)   Received from [facility]   Hunger Vital Sign    Worried About Running Out of Food in the Last Year: Never true    Ran Out of Food in the Last Year: Never true  Transportation Needs: No Transportation Needs (02/25/2023)   Received from [facility]    In the past 12 months, has lack of reliable transportation kept you from medical appointments, meetings, work or from getting things needed for daily living? : No  Physical Activity: Not on file  Stress: Not on file  Social Connections: Unknown (08/24/2021)   [facility] from [facility]   Social Network    Social Network: Not on file    Allergies:  No Known Allergies  Metabolic Disorder Labs: Lab Results  Component Value Date   HGBA1C 5.9 (H) 08/27/2020   MPG 123 08/27/2020   MPG 117 02/16/2020   Lab Results  Component Value Date   PROLACTIN 14.5 02/16/2020   Lab Results  Component Value Date   CHOL 188  04/16/2023   TRIG 143 04/16/2023   HDL 53 04/16/2023   CHOLHDL 3.8 12/16/2021   LDLCALC 110 (H) 04/16/2023   LDLCALC 108 (H) 12/16/2021   Lab Results  Component Value Date   TSH 1.87 12/16/2021    Therapeutic Level Labs: No results found for: "LITHIUM" No results found for: "CBMZ" No results found for: "VALPROATE"  Current Medications: Current Outpatient Medications  Medication Sig Dispense Refill   amphetamine-dextroamphetamine (ADDERALL XR) 30 MG 24 hr capsule Take 1 capsule (30 mg total) by mouth every morning. 90 capsule 0   buPROPion (WELLBUTRIN XL) 300 MG 24 hr tablet Take 1 tablet (300 mg total) by mouth daily. 90 tablet 3  chlorthalidone (HYGROTON) 25 MG tablet Take 1 tablet (25 mg total) by mouth daily. Specialized dosing 90 tablet 3   Cholecalciferol (VITAMIN D3) 30 MCG/15ML LIQD Take by mouth daily.     doxepin (SINEQUAN) 10 MG capsule Take 1-2 capsules (10-20 mg total) by mouth at bedtime as needed. 90 capsule 1   fexofenadine (ALLEGRA ALLERGY) 180 MG tablet Take 1 tablet (180 mg total) by mouth daily. (Patient taking differently: Take 180 mg by mouth at bedtime.) 90 tablet 3   FLUoxetine (PROZAC) 10 MG capsule Take 1 capsule (10 mg total) by mouth daily. 30 capsule 0   FLUoxetine (PROZAC) 20 MG capsule Take 1 capsule (20 mg total) by mouth daily. 90 capsule 3   fluticasone (FLONASE) 50 MCG/ACT nasal spray Place 2 sprays into both nostrils at bedtime.     metoprolol succinate (TOPROL-XL) 50 MG 24 hr tablet Take 1 tablet (50 mg total) by mouth daily with or immediately following a meal. 90 tablet 3   olmesartan (BENICAR) 20 MG tablet Take 2 tablets (40 mg total) by mouth daily. 90 tablet 3   omeprazole (PRILOSEC) 20 MG capsule Take 20 mg by mouth daily.     scopolamine (TRANSDERM-SCOP) 1 MG/3DAYS Place 1 patch (1.5 mg total) onto the skin every 3 (three) days. 4 patch 0   vitamin B-12 (CYANOCOBALAMIN) 100 MCG tablet Take 50 mcg by mouth daily.     No current  facility-administered medications for this visit.     Psychiatric Specialty Exam: Review of Systems  Cardiovascular:  Negative for chest pain.  Neurological:  Negative for tremors.  Psychiatric/Behavioral:  Positive for decreased concentration and dysphoric mood.     There were no vitals taken for this visit.There is no height or weight on file to calculate BMI.  General Appearance: Casual  Eye Contact:  Fair  Speech:  Clear and Coherent  Volume:  Normal  Mood:  stressed and subdued  Affect:  Congruent  Thought Process:  Goal Directed  Orientation:  Full (Time, Place, and Person)  Thought Content:  Rumination  Suicidal Thoughts:  No  Homicidal Thoughts:  No  Memory:  Immediate;   Fair  Judgement:  Fair  Insight:  Fair  Psychomotor Activity:  Normal  Concentration:  Concentration: Fair  Recall:  Fair  Fund of Knowledge:Good  Language: Good  Akathisia:  No  Handed:    AIMS (if indicated):  not done  Assets:  Desire for Improvement Financial Resources/Insurance  ADL's:  Intact  Cognition: WNL  Sleep:   variable    Screenings: GAD-7    Flowsheet Row Office Visit from 04/16/2023 in [facility] Primary Care & [facility] Medicine at [facility] Office Visit from 02/12/2023 in [facility] Primary Care & Sports Medicine at [facility] Office Visit from 05/07/2022 in [facility] Primary Care & Sports Medicine at [facility] Office Visit from 05/22/2020 in [facility] Primary Care & Sports Medicine at [facility] Office Visit from 12/18/2017 in [facility] Primary Care & Sports Medicine at [facility]  Total GAD-7 Score 11 15 15 14 13       [id number]    Flowsheet Row [facility] from [facility]06/2023 in [facility] Office Visit from 04/16/2023 in [facility] Primary Care & Sports Medicine at [facility] Office Visit from 02/12/2023 in [facility] at [facility] Office Visit from 05/07/2022 in [facility] Primary Care & Sports Medicine at [facility] Office Visit from 12/16/2021  in [facility] Primary Care & Sports Medicine at [facility] Total Score 1 3 4 3  0  PHQ-9 Total Score -- 13 18 15  --      Flowsheet Row Office Visit from 06/06/2023 in [facility] ED from 12/16/2022 in [facility] at [facility] ED from 04/17/2022 in [facility] Urgent Care at [facility] RISK CATEGORY No Risk No Risk No Risk       Assessment and Plan: as follows  Prior documentation reviewed  Major depressive disorder recurrent mild to moderate; somewhat subdued and stressed, planning to change job that may help Is scheduled for therapy, increase prozac by 10mg . Total dose be 30mg  , continue wellbutrin  Generalized anxiety disorder; stressed, increase prozac as above and compliance with therapy discussed  ADHD; self-reported . Is on adderall that helps with attention, understands the risk and side effect Denies heachaches, palpitation. Recommend to get BP and vitals assessed when visiting providers or by herself  Fu 6 weeks or earlier if needed     Collaboration of Care: Primary Care Provider AEB notes and chart reviewed  Patient/Guardian was advised Release of Information must be obtained prior to any record release in order to collaborate their care with an outside provider. Patient/Guardian was advised if they have not already done so to contact the registration department to sign all necessary forms in order for [place] to release information regarding their care.   Consent: Patient/Guardian gives verbal consent for treatment and assignment of benefits for services provided during this visit. Patient/Guardian expressed understanding and agreed to proceed.   Thresa Ross, MD 3/18/202510:37 AM

## 2023-07-14 ENCOUNTER — Ambulatory Visit (INDEPENDENT_AMBULATORY_CARE_PROVIDER_SITE_OTHER): Payer: 59 | Admitting: Licensed Clinical Social Worker

## 2023-07-14 DIAGNOSIS — F331 Major depressive disorder, recurrent, moderate: Secondary | ICD-10-CM

## 2023-07-14 DIAGNOSIS — F411 Generalized anxiety disorder: Secondary | ICD-10-CM | POA: Diagnosis not present

## 2023-07-14 DIAGNOSIS — F900 Attention-deficit hyperactivity disorder, predominantly inattentive type: Secondary | ICD-10-CM | POA: Diagnosis not present
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
## 2023-07-17 ENCOUNTER — Other Ambulatory Visit (HOSPITAL_COMMUNITY): Payer: Self-pay

## 2023-08-03 ENCOUNTER — Ambulatory Visit (INDEPENDENT_AMBULATORY_CARE_PROVIDER_SITE_OTHER): Admitting: Licensed Clinical Social Worker

## 2023-08-03 ENCOUNTER — Encounter (HOSPITAL_COMMUNITY): Payer: Self-pay

## 2023-08-03 DIAGNOSIS — F331 Major depressive disorder, recurrent, moderate: Secondary | ICD-10-CM

## 2023-08-03 DIAGNOSIS — F411 Generalized anxiety disorder: Secondary | ICD-10-CM | POA: Diagnosis not present

## 2023-08-03 DIAGNOSIS — F900 Attention-deficit hyperactivity disorder, predominantly inattentive type: Secondary | ICD-10-CM | POA: Diagnosis not present

## 2023-08-03 NOTE — Progress Notes (Signed)
 THERAPIST PROGRESS NOTE  Session Time: 4:00 PM to 4:45 PM  Participation Level: Active  Behavioral Response: CasualAlertappropriate  Type of Therapy: Individual Therapy  Treatment Goals addressed: Work on anxiety work on Research officer, trade union, work on a big part of issues is financial and being impulsive working on better coping in general  ProgressTowards Goals: Initial-introduced CBT strategies along with emotional regulation strategies to begin to help patient develop coping strategies to work on goals  Interventions: CBT, Solution Focused, Supportive, and Other: Coping  Summary: Natalie Hopkins is a 48 y.o. female who presents with looking at emotional regulation after we finished treatment plan therapist noted some key concepts (see below) patient says does this with anger used to have a lot of uses these techniques in anger management strategy. Angry stop and think about it is this worth getting mad about it.  Worked on issues with impulsivity and finances patient will try to bring cash on weekends help her to keep track of it.  Talked about relationship with landlord also personal relationship talked about the avoidance comes from not wanting to take advantage she is disappointed she does not have money.  Has noted not being as impulsive on weekends may help with that talked about other issues in this relationship. Does she want to keep doing this come to end long-term goal don't know can give him what wants. Struggle to figure this out. He has said if you have to back off respect that and understand. Doesn't make false promises. Younger woman he met. Friend of their sister. She is not ready he is not ready he doesn't know at this point.  Some aspects that make her attractive still has not spent a lot of time with her.  They are the same age they have kids the same age she wants to have another kid eventually he is still undecided about the other kid. Good characteristic that fits.  Patient and her  don't keep things from each other. He is not pursuing her at this point, sister of one of his friends. Only have spend time with all the kids. They are honest and patient thinks it through before bringing up in conversation if don't want to know don't talk about it. Felt right time. Timing is everything.  Reviewed session patient said which she got avoidance is bad ways to not be impulsive.  Therapist noted reviewing decision on her relationship as we continue to work on anxiety  Completed treatment plan in session helped in terms of direction for session and treatment in general.  Noted needing to work on avoidance impulsiveness.  Therapist started off by teaching patient some emotional regulation skills to help with impulsiveness noted the key is slowing your self down so that you are not reacting but responding can be more thoughtful how you respond by slowing down patient understands as uses this for anger management.  Naming the emotion helps to slow you down more awareness helps with better emotional regulation.  Looked at stop skills which is a process of helping to slow down as noted in the handout learned this 1 key skill and you can start to take control of your emotions in your life between the stimulus and response there is a space in that space lies or freedom to choose our response in our response laser growth and her freedom "by Barnie Alderman.  Noted the guidance of stop makes you say to yourself and your head stop as soon as you noticed the mind her  body is reacting to a trigger stop helps to put in the space between the stimulus to trigger what ever we are reacting to in her response the earlier that you you stop the easier more effective it will be take of breath breathing a little deeper and slower will calm down and reduce the physical reaction of emotion adrenaline and through the nose out through the mouth the brains reset mechanism.  Focusing on her breathing means were not so focused on  the thoughts and feelings of the distress surmise Concerta clear and we can think more logically and rationally observe we noticed the thoughts going through her mind we can notice what we feel in her body and make and noticed the urge to react in an impulsive way we can note the vicious cycle of anxiety sadness or anger noticing helps us  to diffuse from these thoughts and feelings and therefore reduce her power and control pullback put in perspective the thought challenging of CBT thinking differently do not believe everything you think thoughts or thoughts they are not fact challenged him with logic step back emotionally from the situation and start to see the bigger picture it reduces those distressing believes we can do this by asking ourselves questions practice what works and proceed.  Therapist work with patient on impulsiveness related to finances plan is for her to carry money on the weekend so that she will run into as much trouble with finances.  We looked at a mindfulness video again showing how we want to look at slowing herself down so she reacts to wisely not blindly.  Therapist provided support and space for patient talked about thoughts and feelings talked about her relationship as an issue figuring out making healthy choices for herself in this relationship.  Suicidal/Homicidal: No  Plan: Return again in 2 weeks.2.  Patient work on her impulsivity by bringing cash, work on avoidance work on slowing herself down and reactivity therapist review skills utilized next session 3.  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 Patient/Guardian was advised if  they have not already done so to contact the registration department to sign all necessary forms in order for us  to release information regarding their care.   Consent: Patient/Guardian gives verbal consent for treatment and assignment of benefits for services provided during this visit. Patient/Guardian expressed understanding and agreed to proceed.   Dallie Duel, LCSW 08/03/2023

## 2023-08-07 ENCOUNTER — Other Ambulatory Visit (HOSPITAL_COMMUNITY): Payer: Self-pay

## 2023-08-07 ENCOUNTER — Encounter (HOSPITAL_COMMUNITY): Payer: Self-pay | Admitting: Psychiatry

## 2023-08-07 ENCOUNTER — Telehealth (INDEPENDENT_AMBULATORY_CARE_PROVIDER_SITE_OTHER): Admitting: Psychiatry

## 2023-08-07 DIAGNOSIS — F411 Generalized anxiety disorder: Secondary | ICD-10-CM | POA: Diagnosis not present

## 2023-08-07 DIAGNOSIS — F900 Attention-deficit hyperactivity disorder, predominantly inattentive type: Secondary | ICD-10-CM | POA: Diagnosis not present

## 2023-08-07 DIAGNOSIS — F331 Major depressive disorder, recurrent, moderate: Secondary | ICD-10-CM

## 2023-08-07 MED ORDER — FLUOXETINE HCL 20 MG PO CAPS
20.0000 mg | ORAL_CAPSULE | Freq: Every day | ORAL | 0 refills | Status: DC
  Filled 2023-08-07 – 2023-08-28 (×2): qty 90, 90d supply, fill #0

## 2023-08-07 MED ORDER — FLUOXETINE HCL 10 MG PO CAPS
10.0000 mg | ORAL_CAPSULE | Freq: Every day | ORAL | 0 refills | Status: DC
  Filled 2023-08-07 – 2023-08-28 (×2): qty 90, 90d supply, fill #0

## 2023-08-07 MED ORDER — BUPROPION HCL ER (XL) 300 MG PO TB24
300.0000 mg | ORAL_TABLET | Freq: Every day | ORAL | 0 refills | Status: DC
  Filled 2023-08-07 – 2023-08-28 (×2): qty 90, 90d supply, fill #0

## 2023-08-07 NOTE — Progress Notes (Signed)
 [facility] Follow up visit  Patient Identification: Natalie Hopkins MRN:  [id number] Date of Evaluation:  08/07/2023 Referral Source: primary care. Dr. Velma Hopkins Complaint:   No Hopkins complaint on file. Follow up depression, anxiety Visit Diagnosis:    ICD-10-CM   1. MDD (major depressive disorder), recurrent episode, moderate (HCC)  F33.1     2. GAD (generalized anxiety disorder)  F41.1     3. Attention deficit hyperactivity disorder (ADHD), inattentive type, moderate  F90.0     Virtual Visit via Video Note  I connected with Natalie Hopkins on 08/07/23 at 11:30 AM EDT by a video enabled telemedicine application and verified that I am speaking with the correct person using two identifiers.  Location: Patient: parked car Provider: home office   I discussed the limitations of evaluation and management by telemedicine and the availability of in person appointments. The patient expressed understanding and agreed to proceed.     I discussed the assessment and treatment plan with the patient. The patient was provided an opportunity to ask questions and all were answered. The patient agreed with the plan and demonstrated an understanding of the instructions.   The patient was advised to call back or seek an in-person evaluation if the symptoms worsen or if the condition fails to improve as anticipated.  I provided 18 minutes of non-face-to-face time during this encounter.    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  Patient has been married for more than 14 years and recently separated referred initially by primary care physician establish care with history of depression, anxiety and ADHD  In general  doing fair feel meds keeping balance no side effects, likes her new job Goes to [employer] in a leaugue  Current meds include  Wellbutrin  300, Prozac  20 mg, Adderall 30  mg  Aggravating factors; seperation,  Modifying factors;kids, ,  her grandkids a special friend she also plays billiard report and also goes for hike  Duration since age 28  Bariatric surgery 2014 at that time she was 300 pounds now her weight is 210 self-reported also height 5 foot 2 inches     Past Psychiatric History: depression, anxiety, adhd, questionable bipolar  Previous Psychotropic Medications: Yes   Substance Abuse History in the last 12 months:  No.  Consequences of Substance Abuse: NA  Past Medical History:  Past Medical History:  Diagnosis Date   ADHD (attention deficit hyperactivity disorder)    takes vyvanse    Anal fissure    Anemia    comes and go   Anxiety    Arthritis    Back pain    Depression    Diverticulosis    on CT   Gallstones    GERD (gastroesophageal reflux disease)    Hypertension    pt lost weight and went away   Migraine    Morbid obesity (HCC)    Multiple gastric ulcers    Pancreatitis    Pre-diabetes     Past Surgical History:  Procedure Laterality Date   ABLATION     BARIATRIC SURGERY  2014   Sleeve Gsatrectomy   CHOLECYSTECTOMY  2000   ECTOPIC PREGNANCY SURGERY     twice, with removal of 1 tube 1997 and 1998   ESOPHAGOGASTRODUODENOSCOPY     August 2024 for foreign body   HYSTEROSCOPY WITH NOVASURE N/A 03/06/2021   Procedure: HYSTEROSCOPY; DILATATION AND CURETTAGE  WIITH NOVASURE ABLATION;  Surgeon: Cleotilde Ronal RAMAN, MD;  Location: [facility];  Service: Gynecology;  Laterality: N/A;   TUBAL LIGATION      Family Psychiatric History: dad : bipolar or schizophrenia, cocaine use  Family History:  Family History  Problem Relation Age of Onset   Bladder Cancer Mother    Irregular heart beat Mother    Asthma Father    Breast cancer Maternal Grandmother    Diabetes Paternal Grandfather    Ovarian cancer Maternal Aunt     Social History:   Social History   Socioeconomic History   Marital status: Married     Spouse name: Not on file   Number of children: 3   Years of education: Not on file   Highest education level: Not on file  Occupational History   Occupation: insurance billing  Tobacco Use   Smoking status: Never   Smokeless tobacco: Never  Vaping Use   Vaping status: Never Used  Substance and Sexual Activity   Alcohol use: Yes    Comment: occasionally   Drug use: No   Sexual activity: Yes    Partners: Male    Birth control/protection: Surgical  Other Topics Concern   Not on file  Social History Narrative   Not on file   Social Drivers of Health   Financial Resource Strain: Not on file  Food Insecurity: Low Risk  (02/25/2023)   Received from [facility]   Hunger Vital Sign    Worried About Running Out of Food in the Last Year: Never true    Ran Out of Food in the Last Year: Never true  Transportation Needs: No Transportation Needs (02/25/2023)   Received from [facility]    In the past 12 months, has lack of reliable transportation kept you from medical appointments, meetings, work or from getting things needed for daily living? : No  Physical Activity: Not on file  Stress: Not on file  Social Connections: Unknown (08/24/2021)   [facility]   Social Network    Social Network: Not on file    Allergies:  No Known Allergies  Metabolic Disorder Labs: Lab Results  Component Value Date   HGBA1C 5.9 (H) 08/27/2020   MPG 123 08/27/2020   MPG 117 02/16/2020   Lab Results  Component Value Date   PROLACTIN 14.5 02/16/2020   Lab Results  Component Value Date   CHOL 188 04/16/2023   TRIG 143 04/16/2023   HDL 53 04/16/2023   CHOLHDL 3.8 12/16/2021   LDLCALC 110 (H) 04/16/2023   LDLCALC 108 (H) 12/16/2021   Lab Results  Component Value Date   TSH 1.87 12/16/2021    Therapeutic Level Labs: No results found for: LITHIUM No results found for: CBMZ No results found for: VALPROATE  Current  Medications: Current Outpatient Medications  Medication Sig Dispense Refill   amphetamine -dextroamphetamine  (ADDERALL XR) 30 MG 24 hr capsule Take 1 capsule (30 mg total) by mouth every morning. 90 capsule 0   buPROPion  (WELLBUTRIN  XL) 300 MG 24 hr tablet Take 1 tablet (300 mg total) by mouth daily. 90 tablet 0   chlorthalidone  (HYGROTON ) 25 MG tablet Take 1 tablet (25 mg total) by mouth daily. Specialized dosing 90 tablet 3   Cholecalciferol (VITAMIN D3) 30 MCG/15ML LIQD Take by mouth daily.     doxepin  (SINEQUAN ) 10 MG capsule Take 1-2 capsules (10-20 mg total) by mouth at bedtime as needed. 90 capsule 1  fexofenadine  (ALLEGRA  ALLERGY) 180 MG tablet Take 1 tablet (180 mg total) by mouth daily. (Patient taking differently: Take 180 mg by mouth at bedtime.) 90 tablet 3   FLUoxetine  (PROZAC ) 10 MG capsule Take 1 capsule (10 mg total) by mouth daily. 90 capsule 0   FLUoxetine  (PROZAC ) 20 MG capsule Take 1 capsule (20 mg total) by mouth daily. 90 capsule 0   fluticasone (FLONASE) 50 MCG/ACT nasal spray Place 2 sprays into both nostrils at bedtime.     metoprolol  succinate (TOPROL -XL) 50 MG 24 hr tablet Take 1 tablet (50 mg total) by mouth daily with or immediately following a meal. 90 tablet 3   olmesartan  (BENICAR ) 20 MG tablet Take 2 tablets (40 mg total) by mouth daily. 90 tablet 3   omeprazole (PRILOSEC) 20 MG capsule Take 20 mg by mouth daily.     scopolamine  (TRANSDERM-SCOP) 1 MG/3DAYS Place 1 patch (1.5 mg total) onto the skin every 3 (three) days. 4 patch 0   vitamin B-12 (CYANOCOBALAMIN) 100 MCG tablet Take 50 mcg by mouth daily.     No current facility-administered medications for this visit.     Psychiatric Specialty Exam: Review of Systems  Cardiovascular:  Negative for chest pain.  Neurological:  Negative for tremors.  Psychiatric/Behavioral:  Positive for decreased concentration and dysphoric mood.     There were no vitals taken for this visit.There is no height or weight on  file to calculate BMI.  General Appearance: Casual  Eye Contact:  Fair  Speech:  Clear and Coherent  Volume:  Normal  Mood:  better  Affect:  Congruent  Thought Process:  Goal Directed  Orientation:  Full (Time, Place, and Person)  Thought Content:  Rumination  Suicidal Thoughts:  No  Homicidal Thoughts:  No  Memory:  Immediate;   Fair  Judgement:  Fair  Insight:  Fair  Psychomotor Activity:  Normal  Concentration:  Concentration: Fair  Recall:  Fair  Fund of Knowledge:Good  Language: Good  Akathisia:  No  Handed:    AIMS (if indicated):  not done  Assets:  Desire for Improvement Financial Resources/Insurance  ADL's:  Intact  Cognition: WNL  Sleep:   variable    Screenings: GAD-7    Flowsheet Row Office Visit from 04/16/2023 in [facility] Primary Care & Sports Medicine at [facility] Office Visit from 02/12/2023 in [facility] Primary Care & Sports Medicine at [facility] Office Visit from 05[facility]07/2022 in [facility] Primary Care & Sports Medicine at [facility] Office Visit from 05/22/2020 in [facility] Primary Care & Sports Medicine at [facility] Office Visit from 12/18/2017 in [facility] Primary Care & Sports Medicine at [facility]  Total GAD-7 Score 11 15 15 14 13       PHQ2-9    Flowsheet Row Counselor from 07/14/2023 in [facility] at [facility] Office Visit from 06/06/2023 in [facility] Office Visit from 04/16/2023 in [facility] Primary Care & Sports Medicine at [facility] Office Visit from 02/12/2023 in [facility] Primary Care & Sports Medicine at [facility] Office Visit from 05/07/2022 in [facility] Primary Care & Sports Medicine at [facility]  PHQ-2 Total Score 0 1 3 4 3   PHQ-9 Total Score -- -- 13 18 15       Flowsheet Row Counselor from 07/14/2023 in [facility] at  [facility] Video Visit from 07/07/2023 in [facility] at [facility]  Visit from [facility]  C-SSRS RISK CATEGORY No Risk No Risk No Risk       Assessment and Plan: as follows  Prior documentation reviewed  Major depressive disorder recurrent mild to moderate;  doing better since last visit and prozac  was increased. Continue wellbutrin  as well Generalized anxiety disorder; better continue prozac  30mg    ADHD; self-reported .is on adderall by PCP,  cautioned and monitor BP and side effects reviewed  fU 6 m or earlier if needed Refills sent which were due     Collaboration of Care: Primary Care Provider AEB notes and chart reviewed  Patient/Guardian was advised Release of Information must be obtained prior to any record release in order to collaborate their care with an outside provider. Patient/Guardian was advised if they have not already done so to contact the registration department to sign all necessary forms in order for us  to release information regarding their care.   Consent: Patient/Guardian gives verbal consent for treatment and assignment of benefits for services provided during this visit. Patient/Guardian expressed understanding and agreed to proceed.   Jackey Flight, MD 4/18/202511:32 AM

## 2023-08-17 ENCOUNTER — Ambulatory Visit (INDEPENDENT_AMBULATORY_CARE_PROVIDER_SITE_OTHER): Payer: Self-pay | Admitting: Licensed Clinical Social Worker

## 2023-08-17 ENCOUNTER — Other Ambulatory Visit (HOSPITAL_COMMUNITY): Payer: Self-pay

## 2023-08-17 DIAGNOSIS — F331 Major depressive disorder, recurrent, moderate: Secondary | ICD-10-CM

## 2023-08-17 DIAGNOSIS — F411 Generalized anxiety disorder: Secondary | ICD-10-CM

## 2023-08-17 DIAGNOSIS — F900 Attention-deficit hyperactivity disorder, predominantly inattentive type: Secondary | ICD-10-CM

## 2023-08-18 NOTE — Progress Notes (Signed)
 Patient did not show for appointment.

## 2023-08-24 ENCOUNTER — Ambulatory Visit (INDEPENDENT_AMBULATORY_CARE_PROVIDER_SITE_OTHER): Admitting: Licensed Clinical Social Worker

## 2023-08-24 DIAGNOSIS — F411 Generalized anxiety disorder: Secondary | ICD-10-CM

## 2023-08-24 DIAGNOSIS — F331 Major depressive disorder, recurrent, moderate: Secondary | ICD-10-CM

## 2023-08-24 DIAGNOSIS — F900 Attention-deficit hyperactivity disorder, predominantly inattentive type: Secondary | ICD-10-CM | POA: Diagnosis not present

## 2023-08-24 NOTE — Progress Notes (Signed)
 THERAPIST PROGRESS NOTE  Session Time: 8:00 AM to 4:48 AM  Participation Level: Active  Behavioral Response: CasualAlertAnxious and Euthymic  Type of Therapy: Individual Therapy  Treatment Goals addressed: Work on anxiety work on Research officer, trade union, work on a big part of issues is financial and being impulsive working on better coping in general  ProgressTowards Goals: Progressing-therapist can see some positive developments new job having a talk with partner they gave her some reassurance addressed ongoing stressors in session working on coping skills  Interventions: Solution Focused, Strength-based, Supportive, and Other: coping  Summary: Natalie Hopkins is a 48 y.o. female who presents with accepted a new job. Stressful at home job and has to get it set up at home. Place a disaster why stressing. The job will be [facility] system out of [place]  [employer] [employer] open to remote people in other states. Making more money and saving money. 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 Insurance payments come in denied or why haven't paid looking for trends. 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  Has a good conversation with her boyfriend yesterday causing her to stress less. Shared if there is slim chance life long partners unlikely not want to go down that path. He said not looking for anybody else honest can't promise anything. Things are great with how they are. Don't want to complicate things. Sometimes feel don't have a chance long term that what bothers her the most. He said it would be hard to find somebody like her he is realist not that could happen but changes are low. Something he said took weight off shoulders that is what was waiting for. Issue is he always thinks something better.  Therapist normalizes this is a way people think and then find out not  the case so more a cognitive tendency.  At the same time cannot just discount that totally.  She made her feelings very clean he knew how much like him love him wouldn't walk away. Made him think when he got home and he was different. In this four months closer. Met his son talked about enjoying time with his son Cleora Daft.  Enjoys camping. Loves outdoors.  Showed therapist pictures of [facility] therapist very enthusiastic wants to go after seeing pictures. Noticing herself slowing down once in awhile. Not so much not spending on the weekend. Subconscious gets the best her when conscious says don't do it.   With boyfriend has to let go and ride it out. So good but paranoid will be taking away from her. Figure out how not to let it affect her and him. Know how he is don't put stress on him by her being stressed. Says to herself he wouldn't introduce her to son without being around a long time. Let things be things ok in the moment.   Patient updated therapist stressed but also very positive news getting another job should be able to save money which will be very helpful be able to work at home.  Talked about the stress of getting it ready the house is a mess related to having to exterminate rats.  Therapist noted this is stressful but that is not the stressful part for patient.  Worked on relationship issues positive they had a talk was very reassuring therapist noted this is an indication of not avoiding  which we are working on.  Patient shared what she is focused on this getting better at letting go and letting it ride with relationship always feels like could disappear.  Therapist noted strategies including reframe positive, acceptance, mindfulness not projecting in the future you do not know if it is or is not going to happen.  Other good news she met his son patient able to reframe well he would not enter [place] and less he thought she would be around for long time.  He also assigned divorce papers in  general therapist provided support and space for patient to talk about thoughts and feelings in session. Suicidal/Homicidal: No  Plan: Return again in 1 week.2.  Continue to work on avoidance and impulsiveness work on slowing herself down and review the skill use. 3.  Can look at CBT for anxiety mindfulness positive psychology emotional regulation  Diagnosis: Major depressive disorder recurrent moderate generalized anxiety disorder ADHD inattentive type, moderate  Collaboration of Care: Other none needed  Patient/Guardian was advised Release of Information must be obtained prior to any record release in order to collaborate their care with an outside provider. Patient/Guardian was advised if they have not already done so to contact the registration department to sign all necessary forms in order for us  to release information regarding their care.   Consent: Patient/Guardian gives verbal consent for treatment and assignment of benefits for services provided during this visit. Patient/Guardian expressed understanding and agreed to proceed.   Dallie Duel, LCSW 08/24/2023

## 2023-08-28 ENCOUNTER — Other Ambulatory Visit: Payer: Self-pay

## 2023-08-28 ENCOUNTER — Other Ambulatory Visit (HOSPITAL_COMMUNITY): Payer: Self-pay

## 2023-08-28 ENCOUNTER — Other Ambulatory Visit: Payer: Self-pay | Admitting: Family Medicine

## 2023-08-28 ENCOUNTER — Encounter: Payer: Self-pay | Admitting: Family Medicine

## 2023-08-28 MED ORDER — AMPHETAMINE-DEXTROAMPHET ER 30 MG PO CP24: 30.0000 mg | ORAL_CAPSULE | ORAL | 0 refills | Status: DC

## 2023-08-31 ENCOUNTER — Telehealth: Payer: Self-pay | Admitting: Family Medicine

## 2023-08-31 ENCOUNTER — Other Ambulatory Visit (HOSPITAL_COMMUNITY): Payer: Self-pay

## 2023-08-31 MED ORDER — AMPHETAMINE-DEXTROAMPHET ER 30 MG PO CP24
30.0000 mg | ORAL_CAPSULE | ORAL | 0 refills | Status: DC
  Filled 2023-08-31: qty 90, 90d supply, fill #0

## 2023-08-31 NOTE — Telephone Encounter (Signed)
 Copied from CRM [id number]. Topic: Clinical - Medication Refill >> Aug 31, 2023 10:44 AM Shelby Dessert H wrote: Medication: amphetamine -dextroamphetamine  (ADDERALL XR) 30 MG 24 hr capsule  Has the patient contacted their pharmacy? Yes (Agent: If no, request that the patient contact the pharmacy for the refill. If patient does not wish to contact the pharmacy document the reason why and proceed with request.) (Agent: If yes, when and what did the pharmacy advise?)  This is the patient's preferred pharmacy:  [facility] [address] [place] [address] Phone: [phone] Fax: [phone]  Is this the correct pharmacy for this prescription? Yes If no, delete pharmacy and type the correct one.   Has the prescription been filled recently? Yes  Is the patient out of the medication? Yes  Has the patient been seen for an appointment in the last year OR does the patient have an upcoming appointment? Yes  Can we respond through MyChart? Yes  Agent: Please be advised that Rx refills may take up to 3 business days. We ask that you follow-up with your pharmacy.

## 2023-09-01 ENCOUNTER — Encounter: Payer: Self-pay | Admitting: Family Medicine

## 2023-09-01 ENCOUNTER — Ambulatory Visit (INDEPENDENT_AMBULATORY_CARE_PROVIDER_SITE_OTHER): Payer: Self-pay | Admitting: Licensed Clinical Social Worker

## 2023-09-01 DIAGNOSIS — F411 Generalized anxiety disorder: Secondary | ICD-10-CM

## 2023-09-01 DIAGNOSIS — F331 Major depressive disorder, recurrent, moderate: Secondary | ICD-10-CM

## 2023-09-01 DIAGNOSIS — F900 Attention-deficit hyperactivity disorder, predominantly inattentive type: Secondary | ICD-10-CM

## 2023-09-01 NOTE — Progress Notes (Signed)
 Patient did not show for appointment.

## 2023-09-07 ENCOUNTER — Ambulatory Visit (HOSPITAL_COMMUNITY): Admitting: Licensed Clinical Social Worker

## 2023-11-25 ENCOUNTER — Other Ambulatory Visit: Payer: Self-pay | Admitting: Medical Genetics

## 2023-12-01 ENCOUNTER — Other Ambulatory Visit: Payer: Self-pay

## 2023-12-01 DIAGNOSIS — Z006 Encounter for examination for normal comparison and control in clinical research program: Secondary | ICD-10-CM

## 2023-12-02 ENCOUNTER — Other Ambulatory Visit (HOSPITAL_COMMUNITY): Payer: Self-pay

## 2023-12-02 ENCOUNTER — Other Ambulatory Visit: Payer: Self-pay | Admitting: Family Medicine

## 2023-12-03 ENCOUNTER — Other Ambulatory Visit (HOSPITAL_COMMUNITY): Payer: Self-pay

## 2023-12-07 ENCOUNTER — Other Ambulatory Visit (HOSPITAL_COMMUNITY): Payer: Self-pay

## 2023-12-11 LAB — GENECONNECT MOLECULAR SCREEN: Genetic Analysis Overall Interpretation: NEGATIVE

## 2023-12-14 ENCOUNTER — Other Ambulatory Visit (HOSPITAL_COMMUNITY): Payer: Self-pay

## 2023-12-22 ENCOUNTER — Encounter: Payer: Self-pay | Admitting: Sports Medicine

## 2024-01-01 ENCOUNTER — Encounter: Payer: Self-pay | Admitting: Family Medicine

## 2024-01-01 ENCOUNTER — Other Ambulatory Visit: Payer: Self-pay | Admitting: Family Medicine

## 2024-01-01 ENCOUNTER — Other Ambulatory Visit (HOSPITAL_COMMUNITY): Payer: Self-pay

## 2024-01-01 MED ORDER — AMPHETAMINE-DEXTROAMPHET ER 30 MG PO CP24
30.0000 mg | ORAL_CAPSULE | ORAL | 0 refills | Status: DC
  Filled 2024-01-01: qty 30, 30d supply, fill #0

## 2024-01-02 ENCOUNTER — Other Ambulatory Visit (HOSPITAL_COMMUNITY): Payer: Self-pay

## 2024-02-01 ENCOUNTER — Other Ambulatory Visit (HOSPITAL_COMMUNITY): Payer: Self-pay

## 2024-02-01 ENCOUNTER — Other Ambulatory Visit: Payer: Self-pay

## 2024-02-01 ENCOUNTER — Encounter: Payer: Self-pay | Admitting: Family Medicine

## 2024-02-01 ENCOUNTER — Ambulatory Visit: Admitting: Family Medicine

## 2024-02-01 VITALS — BP 137/78 | HR 68 | Ht 63.0 in | Wt 217.0 lb

## 2024-02-01 DIAGNOSIS — F3175 Bipolar disorder, in partial remission, most recent episode depressed: Secondary | ICD-10-CM

## 2024-02-01 DIAGNOSIS — Z113 Encounter for screening for infections with a predominantly sexual mode of transmission: Secondary | ICD-10-CM | POA: Insufficient documentation

## 2024-02-01 DIAGNOSIS — Z23 Encounter for immunization: Secondary | ICD-10-CM

## 2024-02-01 DIAGNOSIS — I10 Essential (primary) hypertension: Secondary | ICD-10-CM

## 2024-02-01 DIAGNOSIS — F900 Attention-deficit hyperactivity disorder, predominantly inattentive type: Secondary | ICD-10-CM | POA: Diagnosis not present

## 2024-02-01 DIAGNOSIS — F908 Attention-deficit hyperactivity disorder, other type: Secondary | ICD-10-CM

## 2024-02-01 DIAGNOSIS — K589 Irritable bowel syndrome without diarrhea: Secondary | ICD-10-CM

## 2024-02-01 MED ORDER — LISDEXAMFETAMINE DIMESYLATE 40 MG PO CAPS
40.0000 mg | ORAL_CAPSULE | ORAL | 0 refills | Status: AC
  Filled 2024-02-01: qty 30, 30d supply, fill #0

## 2024-02-01 MED ORDER — LISDEXAMFETAMINE DIMESYLATE 40 MG PO CAPS
40.0000 mg | ORAL_CAPSULE | ORAL | 0 refills | Status: AC
  Filled 2024-03-21: qty 30, 30d supply, fill #0

## 2024-02-01 NOTE — Assessment & Plan Note (Signed)
 Some increase in loose stool recently.  Possibly anxiety related.  Discussed getting adequate fiber in diet.

## 2024-02-01 NOTE — Progress Notes (Signed)
 Natalie Hopkins - 48 y.o. female MRN [id number]  Date of birth: 10-09-1975  Subjective Chief Complaint  Patient presents with   ADHD   Diarrhea    HPI Natalie Hopkins is a 48 y.o. female here today for follow up visit.   She reports that she is doing ok. Having some more frequent stools which are loose at times.  No blood in the stool.  She had colonoscopy in 2023 with [facility]  She continues on adderall xr 30mg  daily.  She feels that this is not as effective as vyvanse .  Change was made previously due to stock issues with Vyvanse .  She would ike ot try changing back.   She is not experiencing any side effects form this at current strength.    Remains on olmesartan  20mg  and toprol  XL daily. BP is well controlled at this time.  No side effects from this at current strength.  Has not had chest pain, shortness of breath, palpitations, headache or vision changes.   Mood stable with fluoxetine  and bupropion  daily.  Some increased anxiety but feels that she is managing pretty well.   She would like to have STI screening.  Denies symptoms at this time.    ROS:  A comprehensive ROS was completed and negative except as noted per HPI  No Known Allergies  Past Medical History:  Diagnosis Date   ADHD (attention deficit hyperactivity disorder)    takes vyvanse    Anal fissure    Anemia    comes and go   Anxiety    Arthritis    Back pain    Depression    Diverticulosis    on CT   Gallstones    GERD (gastroesophageal reflux disease)    Hypertension    pt lost weight and went away   Migraine    Morbid obesity (HCC)    Multiple gastric ulcers    Pancreatitis    Pre-diabetes     Past Surgical History:  Procedure Laterality Date   ABLATION     BARIATRIC SURGERY  2014   Sleeve Gsatrectomy   CHOLECYSTECTOMY  2000   ECTOPIC PREGNANCY SURGERY     twice, with removal of 1 tube 1997 and 1998   ESOPHAGOGASTRODUODENOSCOPY     August 2024 for foreign body   HYSTEROSCOPY WITH  NOVASURE N/A 03/06/2021   Procedure: HYSTEROSCOPY; DILATATION AND CURETTAGE WIITH NOVASURE ABLATION;  Surgeon: Cleotilde Ronal RAMAN, MD;  Location: [facility];  Service: Gynecology;  Laterality: N/A;   TUBAL LIGATION      Social History   Socioeconomic History   Marital status: Married    Spouse name: Not on file   Number of children: 3   Years of education: Not on file   Highest education level: Not on file  Occupational History   Occupation: insurance billing  Tobacco Use   Smoking status: Never   Smokeless tobacco: Never  Vaping Use   Vaping status: Never Used  Substance and Sexual Activity   Alcohol use: Yes    Comment: occasionally   Drug use: No   Sexual activity: Yes    Partners: Male    Birth control/protection: Surgical  Other Topics Concern   Not on file  Social History Narrative   Not on file   Social Drivers of Health   Financial Resource Strain: Not on file  Food Insecurity: Low Risk  (02/25/2023)   Received from [facility]   Hunger Vital Sign    Within the  past 12 months, you worried that your food would run out before you got money to buy more: Never true    Within the past 12 months, the food you bought just didn't last and you didn't have money to get more. : Never true  Transportation Needs: No Transportation Needs (02/25/2023)   Received from [facility]    In the past 12 months, has lack of reliable transportation kept you from medical appointments, meetings, work or from getting things needed for daily living? : No  Physical Activity: Not on file  Stress: Not on file  Social Connections: Unknown (08/24/2021)   Received from [facility]   Social Network    Social Network: Not on file    Family History  Problem Relation Age of Onset   Bladder Cancer Mother    Irregular heart beat Mother    Asthma Father    Breast cancer Maternal Grandmother    Diabetes Paternal Grandfather    Ovarian cancer Maternal Aunt      Health Maintenance  Topic Date Due   Hepatitis B Vaccines 19-59 Average Risk (1 of 3 - 19+ 3-dose series) Never done   Colonoscopy  Never done   Cervical Cancer Screening (HPV/Pap Cotest)  11/01/2023   Influenza Vaccine  11/20/2023   COVID-19 Vaccine (3 - 2025-26 season) 12/21/2023   Mammogram  03/25/2025   DTaP/Tdap/Td (3 - Td or Tdap) 09/15/2025   Hepatitis C Screening  Completed   HIV Screening  Completed   Pneumococcal Vaccine  Aged Out   HPV VACCINES  Aged Out   Meningococcal B Vaccine  Aged Out     ----------------------------------------------------------------------------------------------------------------------------------------------------------------------------------------------------------------- Physical Exam BP 137/78 (BP Location: Left Arm, Patient Position: Sitting, Cuff Size: Normal)   Pulse 68   Ht 5' 3 (1.6 m)   Wt 217 lb (98.4 kg)   SpO2 100%   BMI 38.44 kg/m   Physical Exam Constitutional:      Appearance: Normal appearance.  HENT:     Head: Atraumatic.  Cardiovascular:     Rate and Rhythm: Normal rate and regular rhythm.  Pulmonary:     Effort: Pulmonary effort is normal.     Breath sounds: Normal breath sounds.  Neurological:     General: No focal deficit present.     Mental Status: She is alert.  Psychiatric:        Mood and Affect: Mood normal.        Behavior: Behavior normal.     ------------------------------------------------------------------------------------------------------------------------------------------------------------------------------------------------------------------- Assessment and Plan  Irritable bowel syndrome Some increase in loose stool recently.  Possibly anxiety related.  Discussed getting adequate fiber in diet.     Attention deficit hyperactivity disorder (ADHD), inattentive type, moderate She felt that Vyvanse  was more effective for her.  Changing back to Vyvanse  40mg  daily.   Bipolar disorder  [facility]) Managed by psychiatry.  Stable with fluoxetine  and bupropion .   Hypertension She is taking combination of olmesartan  and metoprolol .  Doing well with these at this time. Will plan to continue.   Screening examination for STI Orders Placed This Encounter  Procedures   Chlamydia/Gonococcus/Trichomonas, NAA   Mycoplasma / Ureaplasma Culture   Flu vaccine trivalent PF, 6mos and older(Flulaval,Afluria,Fluarix,Fluzone)   HIV antibody (with reflex)   RPR     Meds ordered this encounter  Medications   lisdexamfetamine (VYVANSE ) 40 MG capsule    Sig: Take 1 capsule (40 mg total) by mouth every morning.    Dispense:  30 capsule  Refill:  0   lisdexamfetamine (VYVANSE ) 40 MG capsule    Sig: Take 1 capsule (40 mg total) by mouth every morning.    Dispense:  30 capsule    Refill:  0    Return in about 6 months (around 08/01/2024) for ADHD.

## 2024-02-01 NOTE — Assessment & Plan Note (Signed)
 She felt that Vyvanse  was more effective for her.  Changing back to Vyvanse  40mg  daily.

## 2024-02-01 NOTE — Assessment & Plan Note (Signed)
 She is taking combination of olmesartan  and metoprolol .  Doing well with these at this time. Will plan to continue.

## 2024-02-01 NOTE — Assessment & Plan Note (Signed)
 Managed by psychiatry.  Stable with fluoxetine  and bupropion .

## 2024-02-01 NOTE — Assessment & Plan Note (Signed)
 Orders Placed This Encounter  Procedures   Chlamydia/Gonococcus/Trichomonas, NAA   Mycoplasma / Ureaplasma Culture   Flu vaccine trivalent PF, 6mos and older(Flulaval,Afluria,Fluarix,Fluzone)   HIV antibody (with reflex)   RPR

## 2024-02-02 ENCOUNTER — Other Ambulatory Visit (HOSPITAL_COMMUNITY): Payer: Self-pay | Admitting: Psychiatry

## 2024-02-02 ENCOUNTER — Other Ambulatory Visit (HOSPITAL_COMMUNITY): Payer: Self-pay

## 2024-02-02 ENCOUNTER — Other Ambulatory Visit: Payer: Self-pay

## 2024-02-02 MED ORDER — BUPROPION HCL ER (XL) 300 MG PO TB24
300.0000 mg | ORAL_TABLET | Freq: Every day | ORAL | 0 refills | Status: DC
  Filled 2024-02-02: qty 90, 90d supply, fill #0

## 2024-02-03 ENCOUNTER — Other Ambulatory Visit (HOSPITAL_COMMUNITY): Payer: Self-pay

## 2024-02-03 LAB — CHLAMYDIA/GONOCOCCUS/TRICHOMONAS, NAA
Chlamydia by NAA: POSITIVE — AB
Gonococcus by NAA: NEGATIVE
Trich vag by NAA: NEGATIVE

## 2024-02-04 ENCOUNTER — Ambulatory Visit: Payer: Self-pay | Admitting: Family Medicine

## 2024-02-04 MED ORDER — DOXYCYCLINE HYCLATE 100 MG PO TABS
100.0000 mg | ORAL_TABLET | Freq: Two times a day (BID) | ORAL | 0 refills | Status: AC
  Filled 2024-02-04: qty 14, 7d supply, fill #0

## 2024-02-05 ENCOUNTER — Other Ambulatory Visit (HOSPITAL_COMMUNITY): Payer: Self-pay

## 2024-02-08 LAB — MYCOPLASMA / UREAPLASMA CULTURE: Ureaplasma urealyticum: POSITIVE — AB

## 2024-02-08 LAB — RPR: RPR Ser Ql: NONREACTIVE

## 2024-02-08 LAB — HIV ANTIBODY (ROUTINE TESTING W REFLEX): HIV Screen 4th Generation wRfx: NONREACTIVE

## 2024-02-12 ENCOUNTER — Other Ambulatory Visit (HOSPITAL_COMMUNITY): Payer: Self-pay

## 2024-02-12 ENCOUNTER — Encounter (HOSPITAL_COMMUNITY): Payer: Self-pay | Admitting: Psychiatry

## 2024-02-12 ENCOUNTER — Telehealth (HOSPITAL_COMMUNITY): Admitting: Psychiatry

## 2024-02-12 DIAGNOSIS — F900 Attention-deficit hyperactivity disorder, predominantly inattentive type: Secondary | ICD-10-CM

## 2024-02-12 DIAGNOSIS — F411 Generalized anxiety disorder: Secondary | ICD-10-CM

## 2024-02-12 DIAGNOSIS — F331 Major depressive disorder, recurrent, moderate: Secondary | ICD-10-CM

## 2024-02-12 MED ORDER — FLUOXETINE HCL 40 MG PO CAPS
40.0000 mg | ORAL_CAPSULE | Freq: Every day | ORAL | 0 refills | Status: DC
  Filled 2024-02-12 – 2024-03-21 (×2): qty 30, 30d supply, fill #0

## 2024-02-12 NOTE — Progress Notes (Signed)
 [facility] Follow up visit  Patient Identification: Natalie Hopkins MRN:  [id number] Date of Evaluation:  02/12/2024 Referral Source: primary care. Dr. Velma Chief Complaint:   No chief complaint on file. Follow up depression, anxiety Visit Diagnosis:    ICD-10-CM   1. MDD (major depressive disorder), recurrent episode, moderate (HCC)  F33.1     2. GAD (generalized anxiety disorder)  F41.1     3. Attention deficit hyperactivity disorder (ADHD), inattentive type, moderate  F90.0     Virtual Visit via Video Note  I connected with Natalie Hopkins on 02/12/24 at 10:00 AM EDT by a video enabled telemedicine application and verified that I am speaking with the correct person using two identifiers.  Location: Patient: home Provider: home office   I discussed the limitations of evaluation and management by telemedicine and the availability of in person appointments. The patient expressed understanding and agreed to proceed.      I discussed the assessment and treatment plan with the patient. The patient was provided an opportunity to ask questions and all were answered. The patient agreed with the plan and demonstrated an understanding of the instructions.   The patient was advised to call back or seek an in-person evaluation if the symptoms worsen or if the condition fails to improve as anticipated.  I provided 18 minutes of non-face-to-face time during this encounter.     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  Patient has been married for more than 14 years and recently separated referred initially by primary care physician establish care with history of depression, anxiety and ADHD  On evaluation patient struggling with some job structure adjusting time and also helping a friend for his real state other than that no particular stress but she does feel  anxiety Somewhat subdued but not hopeless Last time Prozac  was increased she felt numb for a while but she wants to work on her anxiety discussed Klonopin but considering she is on Vyvanse  and a controlled substance we discussed about other options  Current meds include  Wellbutrin  300, Prozac  30 mg, Adderall 30 mg  Aggravating factors; separation, loneliness can be Modifying factors;kids, ,  her grandkids a special friend she also plays billiard report and hiking as per history  Duration since age 61  Bariatric surgery 2014 at that time she was 300 pounds now her weight is 210 self-reported also height 5 foot 2 inches     Past Psychiatric History: depression, anxiety, adhd, questionable bipolar  Previous Psychotropic Medications: Yes   Substance Abuse History in the last 12 months:  No.  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;  Service: Gynecology;  Laterality: N/A;   TUBAL LIGATION      Family Psychiatric History: dad : bipolar or schizophrenia, cocaine  use  Family History:  Family History  Problem Relation Age of Onset   Bladder Cancer Mother    Irregular heart beat Mother    Asthma Father    Breast cancer Maternal Grandmother    Diabetes Paternal Grandfather    Ovarian cancer Maternal Aunt     Social History:   Social History   Socioeconomic History   Marital status: Married    Spouse name: Not on file   Number of children: 3   Years of education: Not on file   Highest education level: Not on file  Occupational History   Occupation: insurance billing  Tobacco Use   Smoking status: Never   Smokeless tobacco: Never  Vaping Use   Vaping status: Never Used  Substance and Sexual Activity   Alcohol use: Yes    Comment: occasionally   Drug use: No   Sexual activity: Yes    Partners: Male    Birth control/protection: Surgical  Other Topics Concern   Not on file  Social History Narrative   Not on file   Social Drivers of Health   Financial Resource Strain: Medium Risk (02/01/2024)   Overall Financial Resource Strain (CARDIA)    Difficulty of Paying Living Expenses: Somewhat hard  Food Insecurity: Food Insecurity Present (02/01/2024)   Hunger Vital Sign    Worried About Running Out of Food in the Last Year: Often true    Ran Out of Food in the Last Year: Never true  Transportation Needs: No Transportation Needs (02/01/2024)   PRAPARE - Administrator, Civil Service (Medical): No    Lack of Transportation (Non-Medical): No  Physical Activity: Sufficiently Active (02/01/2024)   Exercise Vital Sign    Days of Exercise per Week: 3 days    Minutes of Exercise per Session: 90 min  Stress: Stress Concern Present (02/01/2024)   [employer] of Occupational Health - Occupational Stress Questionnaire    Feeling of Stress: Very much  Social Connections: Moderately Isolated (02/01/2024)   Social Connection and Isolation Panel    Frequency of Communication with Friends and Family: More than three times a  week    Frequency of Social Gatherings with Friends and Family: Three times a week    Attends Religious Services: Never    Active Member of Clubs or Organizations: Yes    Attends Banker Meetings: 1 to 4 times per year    Marital Status: Divorced    Allergies:  No Known Allergies  Metabolic Disorder Labs: Lab Results  Component Value Date   HGBA1C 5.9 (H) 08/27/2020   MPG 123 08/27/2020   MPG 117 02/16/2020   Lab Results  Component Value Date   PROLACTIN 14.5 02/16/2020   Lab Results  Component Value Date   CHOL 188 04/16/2023   TRIG 143 04/16/2023   HDL 53 04/16/2023   CHOLHDL 3.8 12/16/2021   LDLCALC 110 (H) 04/16/2023   LDLCALC 108 (H) 12/16/2021  Lab Results  Component Value Date   TSH 1.87 12/16/2021    Therapeutic Level Labs: No results found for: LITHIUM No results found for: CBMZ No results found for: VALPROATE  Current Medications: Current Outpatient Medications  Medication Sig Dispense Refill   buPROPion  (WELLBUTRIN  XL) 300 MG 24 hr tablet Take 1 tablet (300 mg total) by mouth daily. 90 tablet 0   chlorthalidone  (HYGROTON ) 25 MG tablet Take 1 tablet (25 mg total) by mouth daily. Specialized dosing 90 tablet 3   Cholecalciferol (VITAMIN D3) 30 MCG/15ML LIQD Take by mouth daily.     doxepin  (SINEQUAN ) 10 MG capsule Take 1-2 capsules (10-20 mg total) by mouth at bedtime as needed. 90 capsule 1   doxycycline  (VIBRA -TABS) 100 MG tablet Take 1 tablet (100 mg total) by mouth 2 (two) times daily for 7 days. 14 tablet 0   fexofenadine  (ALLEGRA  ALLERGY) 180 MG tablet Take 1 tablet (180 mg total) by mouth daily. (Patient taking differently: Take 180 mg by mouth at bedtime.) 90 tablet 3   FLUoxetine  (PROZAC ) 40 MG capsule Take 1 capsule (40 mg total) by mouth daily. 30 capsule 0   fluticasone (FLONASE) 50 MCG/ACT nasal spray Place 2 sprays into both nostrils at bedtime.     lisdexamfetamine (VYVANSE ) 40 MG capsule Take 1 capsule (40 mg total) by  mouth every morning. 30 capsule 0   [START ON 03/02/2024] lisdexamfetamine (VYVANSE ) 40 MG capsule Take 1 capsule (40 mg total) by mouth every morning. 30 capsule 0   metoprolol  succinate (TOPROL -XL) 50 MG 24 hr tablet Take 1 tablet (50 mg total) by mouth daily with or immediately following a meal. 90 tablet 3   olmesartan  (BENICAR ) 20 MG tablet Take 2 tablets (40 mg total) by mouth daily. 90 tablet 3   omeprazole (PRILOSEC) 20 MG capsule Take 20 mg by mouth daily.     scopolamine  (TRANSDERM-SCOP) 1 MG/3DAYS Place 1 patch (1.5 mg total) onto the skin every 3 (three) days. 4 patch 0   vitamin B-12 (CYANOCOBALAMIN) 100 MCG tablet Take 50 mcg by mouth daily.     No current facility-administered medications for this visit.     Psychiatric Specialty Exam: Review of Systems  Cardiovascular:  Negative for chest pain.  Neurological:  Negative for tremors.  Psychiatric/Behavioral:  Positive for decreased concentration and dysphoric mood.     There were no vitals taken for this visit.There is no height or weight on file to calculate BMI.  General Appearance: Casual  Eye Contact:  Fair  Speech:  Clear and Coherent  Volume:  Normal  Mood: Fair but somewhat stressful  Affect:  Congruent  Thought Process:  Goal Directed  Orientation:  Full (Time, Place, and Person)  Thought Content:  Rumination  Suicidal Thoughts:  No  Homicidal Thoughts:  No  Memory:  Immediate;   Fair  Judgement:  Fair  Insight:  Fair  Psychomotor Activity:  Normal  Concentration:  Concentration: Fair  Recall:  Fair  Fund of Knowledge:Good  Language: Good  Akathisia:  No  Handed:    AIMS (if indicated):  not done  Assets:  Desire for Improvement Financial Resources/Insurance  ADL's:  Intact  Cognition: WNL  Sleep:  variable    Screenings: GAD-7    Flowsheet Row Office Visit from 02/01/2024 in [facility] Primary Care & Sports Medicine at [facility] Office Visit from 04/16/2023 in [facility] Primary  Care & Sports Medicine at [facility] Office Visit from 02/12/2023 in [facility] Primary Care &  Sports Medicine at [employer] Office Visit from 05/07/2022 in [facility] Primary Care & Sports Medicine at [facility] Office Visit from 05/22/2020 in [facility] Primary Care & Sports Medicine at [facility]  Total GAD-7 Score 14 11 15 15 14    PHQ2-9    Flowsheet Row Office Visit from 02/01/2024 in [facility] Primary Care & Sports Medicine at [facility] Counselor from 07/14/2023 in [facility] Outpatient Behavioral Health at [facility] Office Visit from 06/06/2023 in [facility] Office Visit from 04/16/2023 in [facility] Primary Care & Sports Medicine at [facility] Office Visit from 02/12/2023 in [facility] Primary Care & Sports Medicine at [facility] Total Score 2 0 1 3 4   PHQ-9 Total Score 11 -- -- 13 18   Flowsheet Row Counselor from 07/14/2023 in [facility]patient Behavioral Health at [facility] Video Visit from 07/07/2023 in [facility] at [facility] Office Visit from [facility]  C-SSRS RISK CATEGORY No Risk No Risk No Risk    Assessment and Plan: as follows Prior documentation reviewed  Major depressive disorder recurrent mild to moderate; manageable somewhat subdued more so on the anxiety component continue Wellbutrin  we will adjust Prozac  as below   generalized anxiety disorder; endorses anxiety feels lonely there is some job structure changes increase Prozac  from 30 to 40 mg  ADHD; self-reported .is on adderall by PCP, cautioned about its effect to anxiety also monitor blood pressure and pulse take drug holidays when possible otherwise she reports attention is manageable on the current medication  Follow-up in 2 months or earlier if needed  reviewed medication questions addressed Prozac  medication adjusted     Collaboration of Care: Primary Care Provider AEB notes and chart reviewed  Patient/Guardian was advised Release of Information must be obtained prior to any record release in order to collaborate their care with an outside provider. Patient/Guardian was advised if they have not already done so to contact the registration department to sign all necessary forms in order for us  to release information regarding their care.   Consent: Patient/Guardian gives verbal consent for treatment and assignment of benefits for services provided during this visit. Patient/Guardian expressed understanding and agreed to proceed.   Jackey Flight, MD 10/24/202510:09 AM

## 2024-02-16 ENCOUNTER — Encounter: Payer: Self-pay | Admitting: Family Medicine

## 2024-02-23 ENCOUNTER — Other Ambulatory Visit (HOSPITAL_COMMUNITY): Payer: Self-pay

## 2024-03-21 ENCOUNTER — Other Ambulatory Visit: Payer: Self-pay

## 2024-03-21 ENCOUNTER — Other Ambulatory Visit (HOSPITAL_COMMUNITY): Payer: Self-pay

## 2024-03-21 ENCOUNTER — Encounter: Payer: Self-pay | Admitting: Family Medicine

## 2024-03-21 ENCOUNTER — Other Ambulatory Visit: Payer: Self-pay | Admitting: Family Medicine

## 2024-03-21 DIAGNOSIS — I10 Essential (primary) hypertension: Secondary | ICD-10-CM

## 2024-03-21 MED ORDER — SCOPOLAMINE 1 MG/3DAYS TD PT72
1.0000 | MEDICATED_PATCH | TRANSDERMAL | 1 refills | Status: AC
  Filled 2024-03-21: qty 4, 12d supply, fill #0

## 2024-03-21 MED ORDER — METOPROLOL SUCCINATE ER 50 MG PO TB24
50.0000 mg | ORAL_TABLET | Freq: Every day | ORAL | 1 refills | Status: AC
  Filled 2024-03-21: qty 90, 90d supply, fill #0

## 2024-03-21 MED ORDER — OLMESARTAN MEDOXOMIL 20 MG PO TABS
40.0000 mg | ORAL_TABLET | Freq: Every day | ORAL | 1 refills | Status: AC
  Filled 2024-03-21: qty 90, 45d supply, fill #0

## 2024-03-22 ENCOUNTER — Other Ambulatory Visit (HOSPITAL_COMMUNITY): Payer: Self-pay

## 2024-04-11 ENCOUNTER — Other Ambulatory Visit (HOSPITAL_COMMUNITY): Payer: Self-pay

## 2024-04-11 ENCOUNTER — Encounter (HOSPITAL_COMMUNITY): Payer: Self-pay | Admitting: Psychiatry

## 2024-04-11 ENCOUNTER — Telehealth (INDEPENDENT_AMBULATORY_CARE_PROVIDER_SITE_OTHER): Admitting: Psychiatry

## 2024-04-11 DIAGNOSIS — F411 Generalized anxiety disorder: Secondary | ICD-10-CM | POA: Diagnosis not present

## 2024-04-11 DIAGNOSIS — F900 Attention-deficit hyperactivity disorder, predominantly inattentive type: Secondary | ICD-10-CM

## 2024-04-11 DIAGNOSIS — F331 Major depressive disorder, recurrent, moderate: Secondary | ICD-10-CM | POA: Diagnosis not present

## 2024-04-11 MED ORDER — FLUOXETINE HCL 40 MG PO CAPS
40.0000 mg | ORAL_CAPSULE | Freq: Every day | ORAL | 0 refills | Status: AC
  Filled 2024-04-11: qty 30, 30d supply, fill #0

## 2024-04-11 MED ORDER — BUPROPION HCL ER (XL) 300 MG PO TB24
300.0000 mg | ORAL_TABLET | Freq: Every day | ORAL | 0 refills | Status: AC
  Filled 2024-04-11: qty 90, 90d supply, fill #0

## 2024-04-11 NOTE — Progress Notes (Signed)
 " [facility] Follow up visit  Patient Identification: Natalie Hopkins MRN:  [id number] Date of Evaluation:  04/11/2024 Referral Source: primary care. Dr. Velma Chief Complaint:   No chief complaint on file. Follow up depression, anxiety Visit Diagnosis:    ICD-10-CM   1. MDD (major depressive disorder), recurrent episode, moderate (HCC)  F33.1     2. GAD (generalized anxiety disorder)  F41.1     3. Attention deficit hyperactivity disorder (ADHD), inattentive type, moderate  F90.0     Virtual Visit via Video Note  I connected with Natalie Hopkins on 04/11/2024 at  1:00 PM EST by a video enabled telemedicine application and verified that I am speaking with the correct person using two identifiers.  Location: Patient: work Provider: home office   I discussed the limitations of evaluation and management by telemedicine and the availability of in person appointments. The patient expressed understanding and agreed to proceed.    I discussed the assessment and treatment plan with the patient. The patient was provided an opportunity to ask questions and all were answered. The patient agreed with the plan and demonstrated an understanding of the instructions.   The patient was advised to call back or seek an in-person evaluation if the symptoms worsen or if the condition fails to improve as anticipated.  I provided 16 minutes of non-face-to-face time during this encounter.   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  Still feels lonely tolerating medication  adjustment of Prozac  last visit has helped her depression  Current meds include  Wellbutrin  300, Prozac  30 mg, Adderall 30 mg  Aggravating factors; separation, loneliness can be Modifying factors;kids, ,  her grandkids a special friend she also plays billiard report and hiking as per history  Duration since age 57  Bariatric surgery 2014 at that time she was 300 pounds now her weight is 210 self-reported also height 5 foot 2 inches     Past Psychiatric History: depression, anxiety, adhd, questionable bipolar  Previous Psychotropic Medications: Yes   Substance Abuse History in the last 12 months:  No.  Consequences of Substance Abuse: NA  Past Medical History:  Past Medical History:  Diagnosis Date   ADHD (attention deficit hyperactivity disorder)    takes vyvanse    Anal fissure    Anemia    comes and go   Anxiety    Arthritis    Back pain    Depression    Diverticulosis    on CT   Gallstones    GERD (gastroesophageal reflux disease)    Hypertension    pt lost weight and went away   Migraine    Morbid obesity (HCC)    Multiple gastric ulcers    Pancreatitis    Pre-diabetes     Past Surgical History:  Procedure Laterality Date   ABLATION     BARIATRIC SURGERY  2014   Sleeve Gsatrectomy   CHOLECYSTECTOMY  2000   ECTOPIC PREGNANCY SURGERY     twice, with removal of 1 tube 1997 and 1998   ESOPHAGOGASTRODUODENOSCOPY  August 2024 for foreign body   HYSTEROSCOPY WITH NOVASURE N/A 03/06/2021   Procedure: HYSTEROSCOPY; DILATATION AND CURETTAGE WIITH NOVASURE ABLATION;  Surgeon: Cleotilde Ronal RAMAN, MD;  Location: [facility];  Service: Gynecology;  Laterality: N/A;   TUBAL LIGATION      Family Psychiatric History: dad : bipolar or schizophrenia, cocaine use  Family History:  Family History  Problem Relation Age of Onset   Bladder Cancer Mother    Irregular heart beat Mother    Asthma Father    Breast cancer Maternal Grandmother    Diabetes  Paternal Grandfather    Ovarian cancer Maternal Aunt     Social History:   Social History   Socioeconomic History   Marital status: Married    Spouse name: Not on file   Number of children: 3   Years of education: Not on file   Highest education level: Not on file  Occupational History   Occupation: insurance billing  Tobacco Use   Smoking status: Never   Smokeless tobacco: Never  Vaping Use   Vaping status: Never Used  Substance and Sexual Activity   Alcohol use: Yes    Comment: occasionally   Drug use: No   Sexual activity: Yes    Partners: Male    Birth control/protection: Surgical  Other Topics Concern   Not on file  Social History Narrative   Not on file   Social Drivers of Health   Tobacco Use: Low Risk (04/11/2024)   Patient History    Smoking Tobacco Use: Never    Smokeless Tobacco Use: Never    Passive Exposure: Not on file  Financial Resource Strain: Medium Risk (02/01/2024)   Overall Financial Resource Strain (CARDIA)    Difficulty of Paying Living Expenses: Somewhat hard  Food Insecurity: Food Insecurity Present (02/01/2024)   Epic    Worried About Programme Researcher, Broadcasting/film/video in the Last Year: Often true    Ran Out of Food in the Last Year: Never true  Transportation Needs: No Transportation Needs (02/01/2024)   Epic    Lack of Transportation (Medical): No    Lack of Transportation (Non-Medical): No  Physical Activity: Sufficiently Active (02/01/2024)   Exercise Vital Sign    Days of Exercise per Week: 3 days    Minutes of Exercise per Session: 90 min  Stress: Stress Concern Present (02/01/2024)   [employer] of Occupational Health - Occupational Stress Questionnaire    Feeling of Stress: Very much  Social Connections: Moderately Isolated (02/01/2024)   Social Connection and Isolation Panel    Frequency of Communication with Friends and Family: More than three times a week    Frequency of Social Gatherings with Friends and Family: Three times a  week    Attends Religious Services: Never    Active Member of Clubs or Organizations: Yes    Attends Banker Meetings: 1 to 4 times per year    Marital Status: Divorced  Depression (PHQ2-9): High Risk (02/01/2024)   Depression (PHQ2-9)    PHQ-2 Score: 11  Alcohol Screen: Low Risk (02/01/2024)   Alcohol Screen    Last Alcohol Screening Score (AUDIT): 1  Housing: Low Risk (02/01/2024)   Epic    Unable to Pay for Housing in the Last Year: No    Number of Times Moved in the Last Year: 0    Homeless in the Last Year: No  Utilities: Not At Risk (02/01/2024)   Epic    Threatened with loss of  utilities: No  Health Literacy: Adequate Health Literacy (02/01/2024)   B1300 Health Literacy    Frequency of need for help with medical instructions: Rarely    Allergies:  No Known Allergies  Metabolic Disorder Labs: Lab Results  Component Value Date   HGBA1C 5.9 (H) 08/27/2020   MPG 123 08/27/2020   MPG 117 02/16/2020   Lab Results  Component Value Date   PROLACTIN 14.5 02/16/2020   Lab Results  Component Value Date   CHOL 188 04/16/2023   TRIG 143 04/16/2023   HDL 53 04/16/2023   CHOLHDL 3.8 12/16/2021   LDLCALC 110 (H) 04/16/2023   LDLCALC 108 (H) 12/16/2021   Lab Results  Component Value Date   TSH 1.87 12/16/2021    Therapeutic Level Labs: No results found for: LITHIUM No results found for: CBMZ No results found for: VALPROATE  Current Medications: Current Outpatient Medications  Medication Sig Dispense Refill   buPROPion  (WELLBUTRIN  XL) 300 MG 24 hr tablet Take 1 tablet (300 mg total) by mouth daily. 90 tablet 0   chlorthalidone  (HYGROTON ) 25 MG tablet Take 1 tablet (25 mg total) by mouth daily. Specialized dosing 90 tablet 3   Cholecalciferol (VITAMIN D3) 30 MCG/15ML LIQD Take by mouth daily.     fexofenadine  (ALLEGRA  ALLERGY) 180 MG tablet Take 1 tablet (180 mg total) by mouth daily. (Patient taking differently: Take 180 mg by mouth at bedtime.)  90 tablet 3   FLUoxetine  (PROZAC ) 40 MG capsule Take 1 capsule (40 mg total) by mouth daily. 30 capsule 0   fluticasone (FLONASE) 50 MCG/ACT nasal spray Place 2 sprays into both nostrils at bedtime.     lisdexamfetamine  (VYVANSE ) 40 MG capsule Take 1 capsule (40 mg total) by mouth every morning. 30 capsule 0   lisdexamfetamine  (VYVANSE ) 40 MG capsule Take 1 capsule (40 mg total) by mouth every morning. 30 capsule 0   metoprolol  succinate (TOPROL -XL) 50 MG 24 hr tablet Take 1 tablet (50 mg total) by mouth daily with or immediately following a meal. 90 tablet 1   olmesartan  (BENICAR ) 20 MG tablet Take 2 tablets (40 mg total) by mouth daily. 90 tablet 1   omeprazole (PRILOSEC) 20 MG capsule Take 20 mg by mouth daily.     scopolamine  (TRANSDERM-SCOP) 1 MG/3DAYS Place 1 patch (1 mg total) onto the skin behind the ear every 3 (three) days.  Remove old patch, fold & discard away from children and pets. 4 patch 1   vitamin B-12 (CYANOCOBALAMIN) 100 MCG tablet Take 50 mcg by mouth daily.     No current facility-administered medications for this visit.     Psychiatric Specialty Exam: Review of Systems  Cardiovascular:  Negative for chest pain.  Neurological:  Negative for tremors.  Psychiatric/Behavioral:  Negative for dysphoric mood.     There were no vitals taken for this visit.There is no height or weight on file to calculate BMI.  General Appearance: Casual  Eye Contact:  Fair  Speech:  Clear and Coherent  Volume:  Normal  Mood: Fair  Affect:  Congruent  Thought Process:  Goal Directed  Orientation:  Full (Time, Place, and Person)  Thought Content:  Rumination  Suicidal Thoughts:  No  Homicidal Thoughts:  No  Memory:  Immediate;   Fair  Judgement:  Fair  Insight:  Fair  Psychomotor Activity:  Normal  Concentration:  Concentration: Fair  Recall:  Fair  Fund of Knowledge:Good  Language: Good  Akathisia:  No  Handed:    AIMS (  if indicated):  not done  Assets:  Desire for  Improvement Financial Resources/Insurance  ADL's:  Intact  Cognition: WNL  Sleep:  variable    Screenings: GAD-7    Flowsheet Row Office Visit from 02/01/2024 in [facility] Primary Care & Sports Medicine at [facility] Office Visit from 04/16/2023 in [facility] Primary Care & Sports Medicine at [facility] Office Visit from 02/12/2023 in [facility] Primary Care & Sports Medicine at [facility] Office Visit from 05/07/2022 in [facility] Primary Care & Sports Medicine at [facility] Office Visit from 05/22/2020 in [facility] Primary Care & Sports Medicine at [facility]  Total GAD-7 Score 14 11 15 15 14    PHQ2-9    Flowsheet Row Office Visit from 02[facility]01/2024 in [facility] Primary Care & Sports Medicine at [facility] Counselor from 07/14/2023 in [facility] at [facility] Office Visit from 06/06/2023 in [facility] Office Visit from 04/16/2023 in [facility] Primary Care & Sports Medicine at [facility] Office Visit from 02/12/2023 in [facility] Primary Care & Sports Medicine at [facility]  PHQ-2 Total Score 2 0 1 3 4   PHQ-9 Total Score 11 -- -- 13 18   Flowsheet Row Counselor from 07/14/2023 in [facility] at [facility] Video Visit from 07/07/2023 in [facility] at [facility] Office Visit from [facility]  C-SSRS RISK CATEGORY No Risk No Risk No Risk    Assessment and Plan: as follows Prior documentation reviewed  Major depressive disorder recurrent mild to moderate; manageable continue Wellbutrin  and Prozac    generalized anxiety disorder; managing it better with Prozac  now at 40 mg continue treatment   ADHD; follows up with primary care physician and has been on Vyvanse  recently denies  having any blood pressure concerns or palpitations  Follow-up in 3 months renewed medication of Wellbutrin  and Prozac  questions addressed refill sent review    Collaboration of Care: Primary Care Provider AEB notes and chart reviewed  Patient/Guardian was advised Release of Information must be obtained prior to any record release in order to collaborate their care with an outside provider. Patient/Guardian was advised if they have not already done so to contact the registration department to sign all necessary forms in order for us  to release information regarding their care.   Consent: Patient/Guardian gives verbal consent for treatment and assignment of benefits for services provided during this visit. Patient/Guardian expressed understanding and agreed to proceed.   Jackey Flight, MD 12/22/20251:17 PM  "

## 2024-04-24 ENCOUNTER — Other Ambulatory Visit (HOSPITAL_COMMUNITY): Payer: Self-pay

## 2024-04-24 MED ORDER — AMOXICILLIN-POT CLAVULANATE 875-125 MG PO TABS
1.0000 | ORAL_TABLET | Freq: Two times a day (BID) | ORAL | 0 refills | Status: AC
  Filled 2024-04-24: qty 14, 7d supply, fill #0

## 2024-07-11 ENCOUNTER — Telehealth (HOSPITAL_COMMUNITY): Admitting: Psychiatry

## 2024-08-01 ENCOUNTER — Ambulatory Visit: Admitting: Family Medicine
